# Patient Record
Sex: Male | Born: 1970 | Race: White | Hispanic: No | Marital: Married | Smoking: Never smoker
Health system: Southern US, Community
[De-identification: ages and names within clinical notes are randomized; demographics above are authoritative.]

## PROBLEM LIST (undated history)

## (undated) ENCOUNTER — Emergency Department (HOSPITAL_COMMUNITY): Discharge status: Absent without leave | Payer: PRIVATE HEALTH INSURANCE

## (undated) DIAGNOSIS — K922 Gastrointestinal hemorrhage, unspecified: Secondary | ICD-10-CM

## (undated) DIAGNOSIS — G459 Transient cerebral ischemic attack, unspecified: Secondary | ICD-10-CM

## (undated) DIAGNOSIS — G43909 Migraine, unspecified, not intractable, without status migrainosus: Secondary | ICD-10-CM

## (undated) DIAGNOSIS — R339 Retention of urine, unspecified: Secondary | ICD-10-CM

## (undated) DIAGNOSIS — Z789 Other specified health status: Secondary | ICD-10-CM

## (undated) DIAGNOSIS — K219 Gastro-esophageal reflux disease without esophagitis: Secondary | ICD-10-CM

## (undated) HISTORY — DX: Transient cerebral ischemic attack, unspecified: G45.9

## (undated) HISTORY — PX: REMOVAL OF GASTROINTESTINAL STOMATIC  TUMOR OF STOMACH: SHX6339

## (undated) HISTORY — DX: Migraine, unspecified, not intractable, without status migrainosus: G43.909

## (undated) HISTORY — PX: HERNIA REPAIR: SHX51

## (undated) HISTORY — PX: OTHER SURGICAL HISTORY: SHX169

## (undated) HISTORY — DX: Gastro-esophageal reflux disease without esophagitis: K21.9

## (undated) HISTORY — PX: CHOLECYSTECTOMY: SHX55

## (undated) HISTORY — DX: Gastrointestinal hemorrhage, unspecified: K92.2

---

## 2006-03-13 ENCOUNTER — Emergency Department (HOSPITAL_COMMUNITY): Admission: EM | Admit: 2006-03-13 | Discharge: 2006-03-13 | Payer: Self-pay | Admitting: Family Medicine

## 2006-04-05 HISTORY — PX: NISSEN FUNDOPLICATION: SHX2091

## 2006-08-11 ENCOUNTER — Ambulatory Visit: Payer: Self-pay | Admitting: Gastroenterology

## 2006-08-17 ENCOUNTER — Ambulatory Visit: Payer: Self-pay | Admitting: Gastroenterology

## 2006-09-05 ENCOUNTER — Ambulatory Visit: Payer: Self-pay | Admitting: Gastroenterology

## 2006-10-10 ENCOUNTER — Ambulatory Visit (HOSPITAL_COMMUNITY): Admission: RE | Admit: 2006-10-10 | Discharge: 2006-10-10 | Payer: Self-pay | Admitting: Gastroenterology

## 2006-10-10 ENCOUNTER — Encounter: Payer: Self-pay | Admitting: Gastroenterology

## 2006-10-28 ENCOUNTER — Ambulatory Visit: Payer: Self-pay | Admitting: Gastroenterology

## 2006-12-20 ENCOUNTER — Encounter: Admission: RE | Admit: 2006-12-20 | Discharge: 2006-12-20 | Payer: Self-pay | Admitting: Surgery

## 2007-01-13 ENCOUNTER — Inpatient Hospital Stay (HOSPITAL_COMMUNITY): Admission: RE | Admit: 2007-01-13 | Discharge: 2007-01-14 | Payer: Self-pay | Admitting: Surgery

## 2007-03-23 ENCOUNTER — Ambulatory Visit: Payer: Self-pay | Admitting: Gastroenterology

## 2007-07-26 DIAGNOSIS — K449 Diaphragmatic hernia without obstruction or gangrene: Secondary | ICD-10-CM | POA: Insufficient documentation

## 2007-07-26 DIAGNOSIS — J329 Chronic sinusitis, unspecified: Secondary | ICD-10-CM | POA: Insufficient documentation

## 2007-08-02 ENCOUNTER — Emergency Department (HOSPITAL_COMMUNITY): Admission: EM | Admit: 2007-08-02 | Discharge: 2007-08-02 | Payer: Self-pay | Admitting: Emergency Medicine

## 2008-04-05 HISTORY — PX: CHOLECYSTECTOMY: SHX55

## 2009-03-07 ENCOUNTER — Inpatient Hospital Stay (HOSPITAL_COMMUNITY): Admission: AD | Admit: 2009-03-07 | Discharge: 2009-03-08 | Payer: Self-pay | Admitting: Surgery

## 2009-03-08 ENCOUNTER — Encounter (INDEPENDENT_AMBULATORY_CARE_PROVIDER_SITE_OTHER): Payer: Self-pay | Admitting: Surgery

## 2009-09-12 ENCOUNTER — Ambulatory Visit (HOSPITAL_BASED_OUTPATIENT_CLINIC_OR_DEPARTMENT_OTHER): Admission: RE | Admit: 2009-09-12 | Discharge: 2009-09-12 | Payer: Self-pay | Admitting: Urology

## 2010-06-22 LAB — POCT HEMOGLOBIN-HEMACUE: Hemoglobin: 14.9 g/dL (ref 13.0–17.0)

## 2010-07-07 LAB — DIFFERENTIAL
Basophils Absolute: 0 10*3/uL (ref 0.0–0.1)
Basophils Relative: 1 % (ref 0–1)
Lymphocytes Relative: 7 % — ABNORMAL LOW (ref 12–46)
Lymphs Abs: 0.5 10*3/uL — ABNORMAL LOW (ref 0.7–4.0)
Monocytes Absolute: 0.8 10*3/uL (ref 0.1–1.0)
Monocytes Relative: 12 % (ref 3–12)
Neutro Abs: 5.6 10*3/uL (ref 1.7–7.7)
Neutrophils Relative %: 81 % — ABNORMAL HIGH (ref 43–77)

## 2010-07-07 LAB — COMPREHENSIVE METABOLIC PANEL
ALT: 724 U/L — ABNORMAL HIGH (ref 0–53)
BUN: 5 mg/dL — ABNORMAL LOW (ref 6–23)
CO2: 24 mEq/L (ref 19–32)
Calcium: 8.9 mg/dL (ref 8.4–10.5)
GFR calc non Af Amer: >60 mL/min (ref >60)
Total Protein: 6.4 g/dL (ref 6.0–8.3)

## 2010-07-07 LAB — AMYLASE: Amylase: 1292 U/L — ABNORMAL HIGH (ref 0–105)

## 2010-07-07 LAB — CBC
MCV: 92 fL (ref 78.0–100.0)
Platelets: 224 10*3/uL (ref 150–400)
RBC: 4.3 MIL/uL (ref 4.22–5.81)
WBC: 6.9 10*3/uL (ref 4.0–10.5)

## 2010-08-18 NOTE — Letter (Signed)
October 28, 2006    Thornton Park. Daphine Deutscher, MD  [address], [address]  [place], [place] [address]   RE:  Jared Henry, Jared Henry  MRN:  [id number]  /  DOB:  06/06/1970   Dear Susy Frizzle:   I would appreciate if you could see my patient, Jared Henry, for  consideration of laparoscopic fundoplication for his continued acid  reflux symptoms and inability to tolerate PPI therapy because of rather  severe allergic reactions.  Enclosed is my note from Aug 11, 2006, and  also a copy of his 24-hour pH probe, and manometry reports.  His  manometry was fairly normal except for borderline incompetent lower  esophageal sphincter.  Somewhat surprising, his 24-hour pH probe study  really showed no significant acid reflux and he was not on therapy at  that time.  He relates that during that time he had a good day.   Jared Henry certainly has rather classic acid reflux symptoms and his  endoscopic exam confirmed a 4-to-5-cm hiatal hernia without erosive  esophagitis.  He had some dysphagia at the time of his procedure and I  did an empiric dilation.  He no longer complains of swallowing  difficulties.  I am convinced that this patient has acid reflux and at  his young age he would probably benefit from  surgical therapy.  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  He is otherwise healthy and on no medications.   Thank you for your time and help in this matter.    Sincerely,      Vania Rea. Jarold Motto, MD, Caleen Essex, FAGA  Electronically Signed    DRP/MedQ  DD: 10/28/2006  DT: 10/28/2006  Job #: [id number]

## 2010-08-18 NOTE — Assessment & Plan Note (Signed)
[facility]                         GASTROENTEROLOGY OFFICE NOTE   NAME:Jared Henry, Jared Henry                           MRN:          [id number]  DATE:10/10/2006                            DOB:          1970/05/09    TWENTY-FOUR HOUR PH PROBE TEST AND ESOPHAGEAL MANOMETRY.   Jared Henry is a 40 year old white male who has had refractory reflux  symptoms with burning substernal chest pain.  He had an allergic  reaction to PPI therapy and is currently on Zantac 300 mg twice a day  but continues with reflux symptoms.  He had endoscopy performed on Aug 17, 2006, which did show a hiatal hernia and no definite stricture of  his esophagus.  He was dilated, however, empirically.   ESOPHAGEAL MANOMETRY REPORT #1:  1. Upper esophageal sphincter - There is normal coordination between      pharyngeal contraction and cricopharyngeal relaxation.  2. Lower esophageal sphincter - Borderline normal pressure here      measuring 15 mmHg with normal relaxation.  3. Motility pattern - There are normally propagated peristaltic waves      throughout the length of the esophagus to wet and dry swallows.      Amplitude of duration throughout the esophagus 65 mmHg with 87 mm      average in the distal esophagus.  All waves appear peristaltic.   ASSESSMENT:  This is essentially a normal esophageal manometry except  for borderline lower esophageal sphincter pressure of 15 mmHg.   TWENTY FOUR HOUR PH PROBE STUDY:  A dual pH probe is inserted on October 10, 2006.  This study is entirely normal with a DeMeester score of 1.1,  normal being less than 22.  There is no significant acid reflux  proximally or distally in the supine or upright position.   ASSESSMENT:  These two studies do show evidence of borderline lower  esophageal sphincter incompetency.  There is certainly no significant  acid reflux on this study and it is unclear to me as to whether or not  he was on his H2 blocker meds  before this study, but we had asked him to  be off of them at least 3 days before his procedure.  If he was off of  his meds and still had such good acid control I am not sure that  fundoplication surgery would be indicated.  However, clinically, the  patient continues to be symptomatic and cannot take PPI therapy.  I will  ask Dr. Wenda Henry to see him, in any case, and review this data and  give [place] his opinion.     Vania Rea. Jarold Motto, MD, Caleen Essex, FAGA  Electronically Signed    DRP/MedQ  DD: 10/20/2006  DT: 10/20/2006  Job #: [id number]   cc:   Thornton Park Daphine Deutscher, MD

## 2010-08-18 NOTE — Assessment & Plan Note (Signed)
[facility]                         GASTROENTEROLOGY OFFICE NOTE   NAME:Roesler, MARJORIE LUSSIER                         MRN:          [id number]  DATE:09/05/2006                            DOB:          1970/09/08    Everet returns for followup after his endoscopy on Aug 17, 2006 with  esophageal dilatation. He cannot tolerate PPIs because of allergic  urticaria. He was initially on Nexium but was switched to AcipHex, he  continued to have problems. He currently is taking Zantac 300 mg twice a  day with mild improvement but continues his acid reflux symptoms,  coughing, and some mild dysphagia.   I have gone ahead and set Wright up for a 24-hour pH probe test and  esophageal manometry and will probably refer him to Dr. Wenda Low at  [facility] for consideration of fundoplication since he  cannot tolerate PPI therapy. I have asked him to hold his H2 blocker  therapy at least 3 days before his procedures. He does have some  Carafate to use on a p.r.n. basis in the interim.     Vania Rea. Jarold Motto, MD, Caleen Essex, FAGA  Electronically Signed    DRP/MedQ  DD: 09/05/2006  DT: 09/05/2006  Job #: [id number]   cc:   Alfredia Client, MD  Thornton Park. Daphine Deutscher, MD

## 2010-08-18 NOTE — Assessment & Plan Note (Signed)
[facility]                         GASTROENTEROLOGY OFFICE NOTE   NAME:Pore, SAATHVIK EVERY                         MRN:          [id number]  DATE:08/11/2006                            DOB:          04/06/1970    Mr. 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 Morrie Sheldon for evaluation of  acid reflux symptoms and dysphagia.   Mr. Morrie Sheldon has been in excellent health all of his life until a couple of  months ago when he developed rather typical burning sternal chest pain  radiating to his shoulders and neck, both during the daytime and  nighttime hours.  He has been on antacids and recently proton pump  inhibitors, but continues to have symptomatology and occasional solid  food dysphagia in his midsubsternal area.  He has not been on  antibiotics, but does complain of hoarseness and a dry cough with some  asthmatic-type symptoms.  He has not had barium studies or endoscopic  exams.  He denies frequent antibiotics or steroid use.  He also  describes occasional odynophagia.  He is currently taking Nexium 40 mg  twice a day an hour before meals, and has had some improvement.  Lab  data per Dr. Morrie Sheldon showed a normal CBC and chest x-ray.   He follows a regular diet and denies any specific food intolerances.  He  denies melena or hematochezia, but has noted some dark stools with Pepto-  Bismol use.  Because of these above complaints, he has lost  approximately 10 pounds of weight over the last 2 months.   PAST MEDICAL HISTORY:  Otherwise entirely negative, except for some  allergic sinusitis problems.   FAMILY HISTORY:  Noncontributory.  His mother does have a hiatal  hernia.   SOCIAL HISTORY:  The patient is married and lives with his wife and  children.  He has a Naval architect.  He does not smoke or use  ethanol.   MEDICATIONS:  1. Nexium 40 mg twice a day.  2. P.r.n. albuterol inhalers.   He denies drug allergies.   REVIEW OF SYSTEMS:  Positive for shortness of breath with exertion, some  wheezing, insomnia, periodic episodes of tachycardia, and some  nonspecific pruritus.  He denies other specific cardiopulmonary  complaints, neurological, or psychiatric difficulties.  He has no  symptomatology consistent with collagen vascular disease.  He  specifically denies Raynaud's phenomenon.   PHYSICAL EXAMINATION:  GENERAL:  A healthy-appearing white male in no  distress, appearing his stated age.  VITAL SIGNS:  He is 5 feet 9 inches tall, and weighs 189 pounds.  Blood  pressure was 124/92, and pulse was 64 and regular.  HEENT:  Examination of the oropharynx was unremarkable.  NECK:  I could not appreciate thyromegaly or lymphadenopathy.  CHEST:  Clear, without wheezes or rhonchi.  HEART:  He was in a regular rhythm, without murmurs, gallops, or rubs.  ABDOMEN:  Could not appreciate hepatosplenomegaly, abdominal masses, or  tenderness.  Bowel sounds were normal.  EXTREMITIES:  Unremarkable.  MENTAL STATUS:  Clear.  RECTAL:  Deferred.   ASSESSMENT:  Mr. Hostetler has rather marked acid reflux, with  extraesophageal manifestations of GERD.  I am concerned about his  dysphagia and odynophagia, and I feel that endoscopy is indicated to  exclude ulcerative esophagitis or other causes of esophagitis such as  fungal or viral infections.  I can see no lesions on his oropharyngeal  exam today.  He is otherwise healthy and has no chronic medical history.  A patient this young with severe acid reflux usually prompts  consideration of fundoplication surgery early in the course of the  disease.   RECOMMENDATIONS:  1. Continue Nexium twice a day 30 minutes before meals.  2. Carafate suspension 5 cc after meals and at bedtime.  3. Endoscopy with probable dilatation and biopsies as soon as      possible.  4. If fundoplication is indicated, we will need to do preoperative      manometry  exam.  5. Patient education movie on acid reflux shown to the patient and his      wife.     Vania Rea. Jarold Motto, MD, Caleen Essex, FAGA  Electronically Signed    DRP/MedQ  DD: 08/11/2006  DT: 08/11/2006  Job #: [id number]   cc:   Alfredia Client, MD

## 2010-08-18 NOTE — Op Note (Signed)
NAME:  Jared Henry, Jared Henry NO.:  [id number]   MEDICAL RECORD NO.:  [id number]          PATIENT TYPE:  OIB   LOCATION:  1537                         FACILITY:  [facility]   PHYSICIAN:  Thornton Park. 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   PROCEDURE:  Laparoscopic Nissen fundoplication over a #56 lighted bougie  dilator closing the hiatus with two pledgeted sutures of Surgidac with a  three suture wrap on the distal esophagus placing clips on the upper two  knots on the wrap.   SURGEON:  Thornton Park. Daphine Deutscher, MD   ASSISTANT:  Adolph Pollack, M.D.   ANESTHESIA:  General endotracheal.   DESCRIPTION OF PROCEDURE:  The patient was taken to room 1, given  general anesthesia.  The abdomen was clipped, prepped with Techni-Care  and draped sterilely.  Access the abdomen was gained using OptiVu  technique using the [employer] device through the left upper  quadrant without difficulty.  Pneumoperitoneum was established and the  scope was introduced.  Standard trocar placements were used and the iron  man was used retract the liver.   Harmonic scalpel was used to take down the gastrohepatic ligament  exposing the right crus, carried anteriorly across to the left side.  I  then went down onto the fundus and started taking down short gastrics.  I did get into some bleeding along one of the short gastrics that  required multiple clip applications before we really regained control.  This stopped this bleeding along with the harmonic scalpel.  The short  gastrics were taken down all the way, taking the stomach fundus off  attachments to the spleen where they were somewhat intimately  attached  at the diaphragm.  This was freed up and this revealed a small hiatal  hernia which was taken down from around the left crus.  At that point, I  had the really dissected both right and left crus and could see beneath  the left side as well.   I went around on the right side again and dissected this area using  Kitner dissectors and put a Penrose drain around the distal end of it.   I then placed two pledgeted sutures posteriorly to close the diaphragm  hiatus.  A #56 dilator was passed by Dr. Carney Living.  The wrap was made  with continuous shoe shine maneuver and invaginated distal esophagus  using three sutures using the Endo stitch.  Clips were applied on the  upper two knots.  These were all tied intracorporeally.  I then  irrigated and surveyed the abdomen.  Some old clot was removed. Surveyed  and no bleeding was noted.  Everything else looked to be in order.  The liver retractor was removed  and the abdomen was deflated.  The port sites were closed with 4-0  Vicryl, Benzoin and Steri-Strips.  The patient tolerated procedure well  and was taken to recovery room in satisfactory condition.      Thornton Park Daphine Deutscher, MD  Electronically Signed     MBM/MEDQ  D:  01/12/2007  T:  01/13/2007  Job:  [id number]   cc:   Alfredia Client, MD  Fax: [phone]   Vania Rea. Jarold Motto, MD, FACG, FACP, FAGA  [address]  [place]  [place] [address]

## 2011-01-14 LAB — CBC
HCT: 36.6 — ABNORMAL LOW
HCT: 36.6 — ABNORMAL LOW
MCHC: 35.3
MCV: 88
RBC: 4.18 — ABNORMAL LOW
RDW: 12.7
RDW: 12.8
RDW: 12.9
WBC: 12.5 — ABNORMAL HIGH
WBC: 7.8

## 2012-01-31 ENCOUNTER — Other Ambulatory Visit (HOSPITAL_COMMUNITY): Payer: Self-pay | Admitting: Family Medicine

## 2012-01-31 ENCOUNTER — Ambulatory Visit (HOSPITAL_COMMUNITY): Payer: PRIVATE HEALTH INSURANCE | Attending: Cardiology | Admitting: Radiology

## 2012-01-31 DIAGNOSIS — R0989 Other specified symptoms and signs involving the circulatory and respiratory systems: Secondary | ICD-10-CM

## 2012-01-31 DIAGNOSIS — H539 Unspecified visual disturbance: Secondary | ICD-10-CM

## 2012-01-31 NOTE — Progress Notes (Signed)
Echocardiogram not performed. Transesopageal echo was ordered by physician but transthoracic was scheduled. Patient and PCP was notified.

## 2012-04-05 DIAGNOSIS — K922 Gastrointestinal hemorrhage, unspecified: Secondary | ICD-10-CM

## 2012-04-05 HISTORY — DX: Gastrointestinal hemorrhage, unspecified: K92.2

## 2012-04-05 HISTORY — PX: REMOVAL OF GASTROINTESTINAL STOMATIC  TUMOR OF STOMACH: SHX6339

## 2013-01-02 ENCOUNTER — Ambulatory Visit (INDEPENDENT_AMBULATORY_CARE_PROVIDER_SITE_OTHER): Payer: PRIVATE HEALTH INSURANCE

## 2013-01-02 ENCOUNTER — Encounter: Payer: Self-pay | Admitting: Family Medicine

## 2013-01-02 ENCOUNTER — Ambulatory Visit (INDEPENDENT_AMBULATORY_CARE_PROVIDER_SITE_OTHER): Payer: PRIVATE HEALTH INSURANCE | Admitting: Family Medicine

## 2013-01-02 VITALS — BP 106/69 | HR 109 | Temp 98.7°F | Ht 68.0 in | Wt 175.4 lb

## 2013-01-02 DIAGNOSIS — M25569 Pain in unspecified knee: Secondary | ICD-10-CM

## 2013-01-02 DIAGNOSIS — M25561 Pain in right knee: Secondary | ICD-10-CM

## 2013-01-02 MED ORDER — METHYLPREDNISOLONE ACETATE 80 MG/ML IJ SUSP: 80.0000 mg | Freq: Once | INTRAMUSCULAR | Status: DC

## 2013-01-02 NOTE — Patient Instructions (Signed)
Knee Pain  The knee is the complex joint between your thigh and your lower leg. It is made up of bones, tendons, ligaments, and cartilage. The bones that make up the knee are:   The femur in the thigh.   The tibia and fibula in the lower leg.   The patella or kneecap riding in the groove on the lower femur.  CAUSES   Knee pain is a common complaint with many causes. A few of these causes are:   Injury, such as:   A ruptured ligament or tendon injury.   Torn cartilage.   Medical conditions, such as:   Gout   Arthritis   Infections   Overuse, over training or overdoing a physical activity.  Knee pain can be minor or severe. Knee pain can accompany debilitating injury. Minor knee problems often respond well to self-care measures or get well on their own. More serious injuries may need medical intervention or even surgery.  SYMPTOMS  The knee is complex. Symptoms of knee problems can vary widely. Some of the problems are:   Pain with movement and weight bearing.   Swelling and tenderness.   Buckling of the knee.   Inability to straighten or extend your knee.   Your knee locks and you cannot straighten it.   Warmth and redness with pain and fever.   Deformity or dislocation of the kneecap.  DIAGNOSIS   Determining what is wrong may be very straight forward such as when there is an injury. It can also be challenging because of the complexity of the knee. Tests to make a diagnosis may include:   Your caregiver taking a history and doing a physical exam.   Routine X-rays can be used to rule out other problems. X-rays will not reveal a cartilage tear. Some injuries of the knee can be diagnosed by:   Arthroscopy a surgical technique by which a small video camera is inserted through tiny incisions on the sides of the knee. This procedure is used to examine and repair internal knee joint problems. Tiny instruments can be used during arthroscopy to repair the torn knee cartilage (meniscus).   Arthrography  is a radiology technique. A contrast liquid is directly injected into the knee joint. Internal structures of the knee joint then become visible on X-ray film.   An MRI scan is a non x-ray radiology procedure in which magnetic fields and a computer produce two- or three-dimensional images of the inside of the knee. Cartilage tears are often visible using an MRI scanner. MRI scans have largely replaced arthrography in diagnosing cartilage tears of the knee.   Blood work.   Examination of the fluid that helps to lubricate the knee joint (synovial fluid). This is done by taking a sample out using a needle and a syringe.  TREATMENT  The treatment of knee problems depends on the cause. Some of these treatments are:   Depending on the injury, proper casting, splinting, surgery or physical therapy care will be needed.   Give yourself adequate recovery time. Do not overuse your joints. If you begin to get sore during workout routines, back off. Slow down or do fewer repetitions.   For repetitive activities such as cycling or running, maintain your strength and nutrition.   Alternate muscle groups. For example if you are a weight lifter, work the upper body on one day and the lower body the next.   Either tight or weak muscles do not give the proper support for your   knee. Tight or weak muscles do not absorb the stress placed on the knee joint. Keep the muscles surrounding the knee strong.   Take care of mechanical problems.   If you have flat feet, orthotics or special shoes may help. See your caregiver if you need help.   Arch supports, sometimes with wedges on the inner or outer aspect of the heel, can help. These can shift pressure away from the side of the knee most bothered by osteoarthritis.   A brace called an "unloader" brace also may be used to help ease the pressure on the most arthritic side of the knee.   If your caregiver has prescribed crutches, braces, wraps or ice, use as directed. The acronym for  this is Schwenn. This means protection, rest, ice, compression and elevation.   Nonsteroidal anti-inflammatory drugs (NSAID's), can help relieve pain. But if taken immediately after an injury, they may actually increase swelling. Take NSAID's with food in your stomach. Stop them if you develop stomach problems. Do not take these if you have a history of ulcers, stomach pain or bleeding from the bowel. Do not take without your caregiver's approval if you have problems with fluid retention, heart failure, or kidney problems.   For ongoing knee problems, physical therapy may be helpful.   Glucosamine and chondroitin are over-the-counter dietary supplements. Both may help relieve the pain of osteoarthritis in the knee. These medicines are different from the usual anti-inflammatory drugs. Glucosamine may decrease the rate of cartilage destruction.   Injections of a corticosteroid drug into your knee joint may help reduce the symptoms of an arthritis flare-up. They may provide pain relief that lasts a few months. You may have to wait a few months between injections. The injections do have a small increased risk of infection, water retention and elevated blood sugar levels.   Hyaluronic acid injected into damaged joints may ease pain and provide lubrication. These injections may work by reducing inflammation. A series of shots may give relief for as long as 6 months.   Topical painkillers. Applying certain ointments to your skin may help relieve the pain and stiffness of osteoarthritis. Ask your pharmacist for suggestions. Many over the-counter products are approved for temporary relief of arthritis pain.   In some countries, doctors often prescribe topical NSAID's for relief of chronic conditions such as arthritis and tendinitis. A review of treatment with NSAID creams found that they worked as well as oral medications but without the serious side effects.  PREVENTION   Maintain a healthy weight. Extra pounds put  more strain on your joints.   Get strong, stay limber. Weak muscles are a common cause of knee injuries. Stretching is important. Include flexibility exercises in your workouts.   Be smart about exercise. If you have osteoarthritis, chronic knee pain or recurring injuries, you may need to change the way you exercise. This does not mean you have to stop being active. If your knees ache after jogging or playing basketball, consider switching to swimming, water aerobics or other low-impact activities, at least for a few days a week. Sometimes limiting high-impact activities will provide relief.   Make sure your shoes fit well. Choose footwear that is right for your sport.   Protect your knees. Use the proper gear for knee-sensitive activities. Use kneepads when playing volleyball or laying carpet. Buckle your seat belt every time you drive. Most shattered kneecaps occur in car accidents.   Rest when you are tired.  SEEK MEDICAL CARE IF:     You have knee pain that is continual and does not seem to be getting better.   SEEK IMMEDIATE MEDICAL CARE IF:   Your knee joint feels hot to the touch and you have a high fever.  MAKE SURE YOU:    Understand these instructions.   Will watch your condition.   Will get help right away if you are not doing well or get worse.  Document Released: 01/17/2007 Document Revised: 06/14/2011 Document Reviewed: 01/17/2007  [employer] Patient Information 2014 [employer].

## 2013-01-02 NOTE — Progress Notes (Signed)
  Subjective:    Patient ID: Jared Henry, male    DOB: 1970/12/03, 42 y.o.   MRN: [id number]  HPI This 42 y.o. male presents for evaluation of right knee pain.  He has been running and Noticed a few days ago that he has right knee dicomfort that is increased when he Flexes his knee in.  He has hx of intolerance to NSAIDS and he has had ulcers and  GI bleeding.   Review of Systems C/o knee pain. No chest pain, SOB, HA, dizziness, vision change, N/V, diarrhea, constipation, dysuria, urinary urgency or frequency or rash.     Objective:   Physical Exam  Vital signs noted  Well developed well nourished male.  HEENT - Head atraumatic Normocephalic Respiratory - Lungs CTA bilateral Cardiac - RRR S1 and S2 without murmur MS - TTP right lateral knee and some mild crepitus. Procedure - Right Lateral knee prepped with ETOH pad and then anesthetized adequately with Ethyl Chloride and then using 11/2 inch 25 gauge needle 2 cc's of 2% lidocaine w/o epi and one cc of 80mg  solumedrol injected under the lateral patella and patient tolerates it well.  He describes immediate relief of his discomfort.  Right knee xray - No acute fracture seen. Prelimnary reading by Angeline Slim    Assessment & Plan:  Right knee pain - Plan: DG Knee 1-2 Views Right, methylPREDNISolone acetate (DEPO-MEDROL) injection 80 mg Right Knee injection and follow up if discomfort continues.

## 2013-01-03 ENCOUNTER — Ambulatory Visit: Payer: Self-pay

## 2013-05-12 ENCOUNTER — Ambulatory Visit (INDEPENDENT_AMBULATORY_CARE_PROVIDER_SITE_OTHER): Payer: PRIVATE HEALTH INSURANCE | Admitting: Family Medicine

## 2013-05-12 ENCOUNTER — Encounter: Payer: Self-pay | Admitting: Family Medicine

## 2013-05-12 VITALS — BP 105/74 | HR 97 | Temp 97.3°F | Ht 68.0 in | Wt 176.0 lb

## 2013-05-12 DIAGNOSIS — J329 Chronic sinusitis, unspecified: Secondary | ICD-10-CM

## 2013-05-12 DIAGNOSIS — J029 Acute pharyngitis, unspecified: Secondary | ICD-10-CM

## 2013-05-12 LAB — POCT RAPID STREP A (OFFICE): RAPID STREP A SCREEN: NEGATIVE

## 2013-05-12 MED ORDER — AMOXICILLIN 500 MG PO CAPS: 500.0000 mg | ORAL_CAPSULE | Freq: Three times a day (TID) | ORAL | Status: DC

## 2013-05-12 NOTE — Patient Instructions (Signed)
Drink plenty of fluids Take Tylenol or Advil as needed for aches pains and fever Use saline nose spray through the day and or use a Nettie pot 3 or 4 times daily for several days Take medication as directed

## 2013-05-12 NOTE — Progress Notes (Signed)
Subjective:    Patient ID: Jared Henry, male    DOB: October 28, 1970, 43 y.o.   MRN: [id number]  HPI Patient here today for cough, sinus, sore throat x 2 weeks .      Patient Active Problem List   Diagnosis Date Noted  . SINUSITIS 07/26/2007  . HIATAL HERNIA 07/26/2007   Outpatient Encounter Prescriptions as of 05/12/2013  Medication Sig  . amitriptyline (ELAVIL) 25 MG tablet Take 75 mg by mouth at bedtime.  Marland Kitchen. zonisamide (ZONEGRAN) 50 MG capsule Take 150 mg by mouth at bedtime.  . [DISCONTINUED] methylPREDNISolone acetate (DEPO-MEDROL) injection 80 mg     Review of Systems  Constitutional: Negative.  Negative for fever.  HENT: Positive for congestion, sinus pressure and sore throat.   Eyes: Negative.   Respiratory: Positive for cough.   Cardiovascular: Negative.   Gastrointestinal: Negative.   Endocrine: Negative.   Genitourinary: Negative.   Musculoskeletal: Negative.   Skin: Negative.   Allergic/Immunologic: Negative.   Neurological: Positive for headaches.  Hematological: Negative.   Psychiatric/Behavioral: Negative.        Objective:   Physical Exam  Nursing note and vitals reviewed. Constitutional: He is oriented to person, place, and time. He appears well-developed and well-nourished. No distress.  HENT:  Head: Normocephalic and atraumatic.  Right Ear: External ear normal.  Left Ear: External ear normal.  Mouth/Throat: Oropharynx is clear and moist. No oropharyngeal exudate.  Nasal congestion bilaterally and bilateral maxillary sinus tenderness.  Eyes: Conjunctivae and EOM are normal. Pupils are equal, round, and reactive to light. Right eye exhibits no discharge. Left eye exhibits no discharge. No scleral icterus.  Neck: Normal range of motion. Neck supple. No tracheal deviation present. No thyromegaly present.  Cardiovascular: Normal rate, regular rhythm and normal heart sounds.  Exam reveals no gallop and no friction rub.   No murmur heard.  At 96 per  minute  Pulmonary/Chest: Effort normal and breath sounds normal. No respiratory distress. He has no wheezes. He has no rales. He exhibits no tenderness.  No axillary nodes, dry cough  Abdominal: Soft. Bowel sounds are normal. He exhibits no mass. There is no tenderness. There is no rebound and no guarding.  Musculoskeletal: Normal range of motion. He exhibits no edema.  Lymphadenopathy:    He has cervical adenopathy (anterior cervical adenopathy on the right).  Neurological: He is alert and oriented to person, place, and time.  Skin: Skin is warm and dry. No rash noted.  Psychiatric: He has a normal mood and affect. His behavior is normal. Judgment and thought content normal.   BP 105/74  Pulse 97  Temp(Src) 97.3 F (36.3 C) (Oral)  Ht 5\' 8"  (1.727 m)  Wt 176 lb (79.833 kg)  BMI 26.77 kg/m2  The rapid strep was negative.     Assessment & Plan:   1. Sore throat - POCT rapid strep A - Strep A culture, throat - amoxicillin (AMOXIL) 500 MG capsule; Take 1 capsule (500 mg total) by mouth 3 (three) times daily.  Dispense: 30 capsule; Refill: 0  2. Rhinosinusitis - amoxicillin (AMOXIL) 500 MG capsule; Take 1 capsule (500 mg total) by mouth 3 (three) times daily.  Dispense: 30 capsule; Refill: 0 Patient Instructions  Drink plenty of fluids Take Tylenol or Advil as needed for aches pains and fever Use saline nose spray through the day and or use a Nettie pot 3 or 4 times daily for several days Take medication as directed   Jared Henry W. Rilley Stash  MD

## 2013-05-16 LAB — STREP A CULTURE, THROAT: STREP A CULTURE: NEGATIVE

## 2013-06-29 ENCOUNTER — Encounter: Payer: Self-pay | Admitting: Family Medicine

## 2013-06-29 ENCOUNTER — Ambulatory Visit (INDEPENDENT_AMBULATORY_CARE_PROVIDER_SITE_OTHER): Payer: PRIVATE HEALTH INSURANCE | Admitting: Family Medicine

## 2013-06-29 VITALS — BP 105/72 | HR 98 | Temp 97.6°F | Ht 68.5 in | Wt 183.8 lb

## 2013-06-29 DIAGNOSIS — M25561 Pain in right knee: Secondary | ICD-10-CM

## 2013-06-29 DIAGNOSIS — M25569 Pain in unspecified knee: Secondary | ICD-10-CM

## 2013-06-29 NOTE — Progress Notes (Signed)
   Subjective:    Patient ID: Jared HasteJohn W Henry, male    DOB: [birth year], 43 y.o.   MRN: [id number]  HPI This 43 y.o. male presents for evaluation of right knee pain and discomfort.  He is allergic to NSAIDs and he wants knee injection.   Review of Systems C/o right knee pain No chest pain, SOB, HA, dizziness, vision change, N/V, diarrhea, constipation, dysuria, urinary urgency or frequency or rash.     Objective:   Physical Exam  Right knee - TTP medial aspect of right knee. No swelling or crepitus.  Procedure - Medial right knee prepped with ETOH pad and then one cc of 1% lido with 1 cc marcaine And one  Cc of kenalog injected into right knee space and patient expresses relief of discomfort and pain.  Bandaid  Applied.      Assessment & Plan:

## 2013-08-21 ENCOUNTER — Ambulatory Visit (INDEPENDENT_AMBULATORY_CARE_PROVIDER_SITE_OTHER): Payer: PRIVATE HEALTH INSURANCE | Admitting: Family Medicine

## 2013-08-21 VITALS — BP 105/78 | HR 85 | Temp 98.7°F | Ht 68.5 in | Wt 185.2 lb

## 2013-08-21 DIAGNOSIS — M25561 Pain in right knee: Secondary | ICD-10-CM

## 2013-08-21 DIAGNOSIS — M25569 Pain in unspecified knee: Secondary | ICD-10-CM

## 2013-08-21 MED ORDER — METHYLPREDNISOLONE (PAK) 4 MG PO TABS: ORAL_TABLET | ORAL | Status: DC

## 2013-08-21 NOTE — Progress Notes (Signed)
   Subjective:    Patient ID: Jared HasteJohn W Henry, male    DOB: 12-19-1970, 43 y.o.   MRN: [id number]  HPI This 43 y.o. male presents for evaluation of right knee discomfort that radiates into his calf region. He notices this when he runs for a half of a mile.  He cannot take NSAIDS due to gastritis when he Takes NSAIDS.   Review of Systems C/o right knee pain No chest pain, SOB, HA, dizziness, vision change, N/V, diarrhea, constipation, dysuria, urinary urgency or frequency, myalgias, arthralgias or rash.     Objective:   Physical Exam Vital signs noted  Well developed well nourished male.  HEENT - Head atraumatic Normocephalic                Eyes - PERRLA, Conjuctiva - clear Sclera- Clear EOMI                Ears - EAC's Wnl TM's Wnl Gross Hearing WNL                Throat - oropharanx wnl Respiratory - Lungs CTA bilateral Cardiac - RRR S1 and S2 without murmur MS - TTP right posterior knee and calf region.       Assessment & Plan:  Knee pain, right - Plan: methylPREDNIsolone (MEDROL DOSPACK) 4 MG tablet Follow up prn  Deatra CanterWilliam J Ethridge Sollenberger FNP

## 2013-09-10 ENCOUNTER — Telehealth: Payer: Self-pay | Admitting: Family Medicine

## 2013-09-11 ENCOUNTER — Encounter: Payer: Self-pay | Admitting: Family Medicine

## 2013-09-11 ENCOUNTER — Ambulatory Visit (INDEPENDENT_AMBULATORY_CARE_PROVIDER_SITE_OTHER): Payer: PRIVATE HEALTH INSURANCE | Admitting: Family Medicine

## 2013-09-11 VITALS — BP 115/84 | HR 87 | Ht 68.0 in | Wt 176.0 lb

## 2013-09-11 DIAGNOSIS — M25561 Pain in right knee: Secondary | ICD-10-CM

## 2013-09-11 DIAGNOSIS — M25569 Pain in unspecified knee: Secondary | ICD-10-CM

## 2013-09-11 DIAGNOSIS — S83241A Other tear of medial meniscus, current injury, right knee, initial encounter: Secondary | ICD-10-CM

## 2013-09-11 DIAGNOSIS — IMO0002 Reserved for concepts with insufficient information to code with codable children: Secondary | ICD-10-CM

## 2013-09-11 NOTE — Patient Instructions (Signed)
I'm concerned you have a medial meniscus tear. Take tylenol 500mg  1-2 tabs three times a day for pain. Can try topical aspercreme, capsaicin, OR flexall up to 4 times a day for pain and inflammation. Glucosamine sulfate 750mg  twice a day is a supplement that may help. Capsaicin topically up to four times a day may also help with pain. Cortisone injections are an option but you've tried two of these already - I don't think a third would be beneficial. It's important that you continue to stay active. Start physical therapy to strengthen muscles around the joint that hurts to take pressure off of the joint itself. Heat or ice 15 minutes at a time 3-4 times a day as needed to help with pain. Follow up with me in 6 weeks. We will consider an MRI if you're still struggling at that visit.

## 2013-09-12 ENCOUNTER — Encounter: Payer: Self-pay | Admitting: Family Medicine

## 2013-09-12 ENCOUNTER — Other Ambulatory Visit: Payer: Self-pay

## 2013-09-12 DIAGNOSIS — M25561 Pain in right knee: Secondary | ICD-10-CM | POA: Insufficient documentation

## 2013-09-12 DIAGNOSIS — M25569 Pain in unspecified knee: Secondary | ICD-10-CM

## 2013-09-12 NOTE — Progress Notes (Signed)
Patient ID: Jared Henry, male   DOB: [birth year], 43 y.o.   MRN: [id number]  PCP: Rudi Heap, MD  Subjective:   HPI: Patient is a 43 y.o. male here as a consult from Nils Pyle FNP for right knee pain.  Patient reports pain started over 9 months ago he started getting right knee pain. No obvious twisting or other injury. Saw PCP for this - tried 2 cortisone injections and medrol dose pack.  First injection provided relief for some months, most recent one only a couple weeks. Radiographs from 10/13 were normal. Has an intolerance to ibuprofen, aleve due to ulcers. No catching, locking, giving out. No prior right knee issues.  Past Medical History  Diagnosis Date  . GERD (gastroesophageal reflux disease)   . Migraines   . TIA (transient ischemic attack)     Current Outpatient Prescriptions on File Prior to Visit  Medication Sig Dispense Refill  . amitriptyline (ELAVIL) 25 MG tablet Take 75 mg by mouth at bedtime.       No current facility-administered medications on file prior to visit.    Past Surgical History  Procedure Laterality Date  . Cholecystectomy    . Hernia repair    . 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 Nexium [Esomeprazole Magnesium] Itching    History   Social History  . Marital Status: Married    Spouse Name: N/A    Number of Children: N/A  . Years of Education: N/A   Occupational History  . Not on file.   Social History Main Topics  . Smoking status: Never Smoker   . Smokeless tobacco: Not on file  . Alcohol Use: No  . Drug Use: No  . Sexual Activity: Not on file   Other Topics Concern  . Not on file   Social History Narrative  . No narrative on file    Family History  Problem Relation Age of Onset  . Cancer Mother   . Hypertension Father     BP 115/84  Pulse 87  Ht 5\' 8"  (1.727 m)  Wt 176 lb (79.833 kg)  BMI 26.77 kg/m2  Review of Systems: See HPI above.     Objective:  Physical Exam:  Gen: NAD  Right knee: No gross deformity, ecchymoses, effusion. TTP medial joint line.  No other tenderness. FROM. Negative ant/post drawers. Negative valgus/varus testing. Negative lachmanns. Positive mcmurrays, apleys, thessalys.  Negative patellar apprehension, clarkes. NV intact distally.    Assessment & Plan:  1. Right knee pain - radiographs negative through they were non weight bearing.  Exam consistent with a medial meniscus tear.  He will start physical therapy with meniscus tear protocol.  Tylenol, glucosamine, topicals discussed.  Will not repeat injection again.  Activities as tolerated.  F/u in 6 weeks.  Consider MRI if not improving as expected.

## 2013-09-12 NOTE — Assessment & Plan Note (Signed)
radiographs negative through they were non weight bearing.  Exam consistent with a medial meniscus tear.  He will start physical therapy with meniscus tear protocol.  Tylenol, glucosamine, topicals discussed.  Will not repeat injection again.  Activities as tolerated.  F/u in 6 weeks.  Consider MRI if not improving as expected.

## 2014-01-26 IMAGING — CR DG KNEE 1-2V*R*
2 series · 2 of 2 positions shown · non-contrast
Comparison: None.

CLINICAL DATA: Right knee pain

EXAM:
RIGHT KNEE - 1-2 VIEW

[view not recorded (1 of 2)]
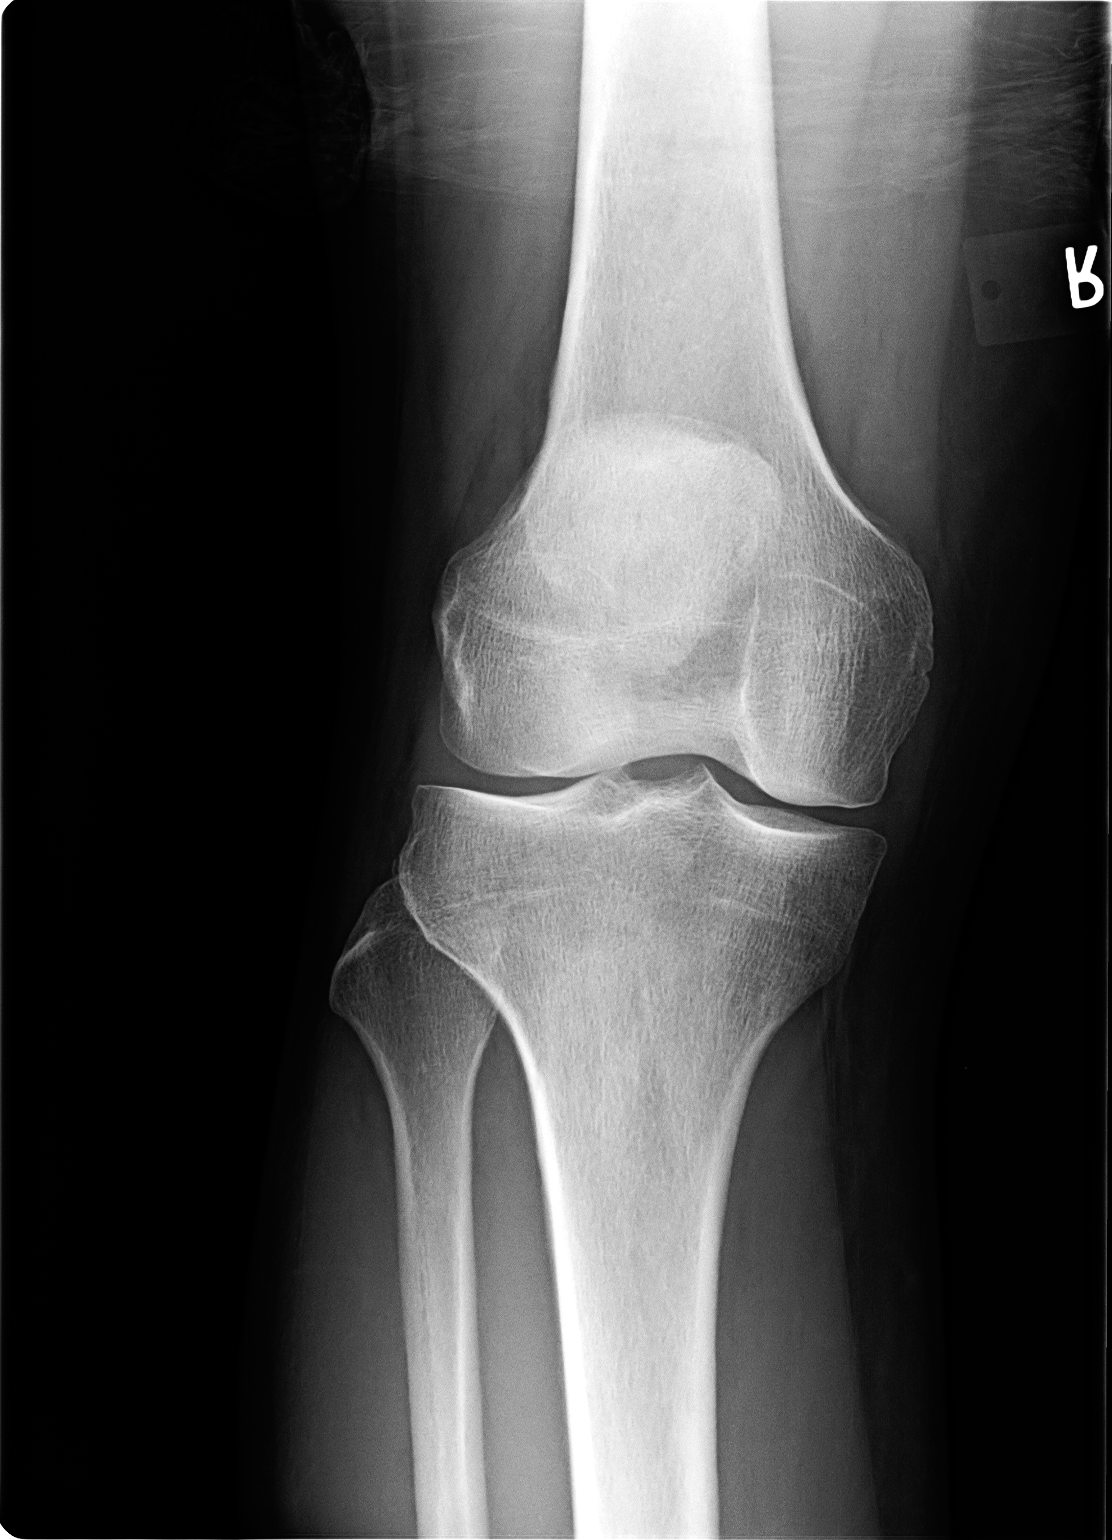

[view not recorded (2 of 2)]
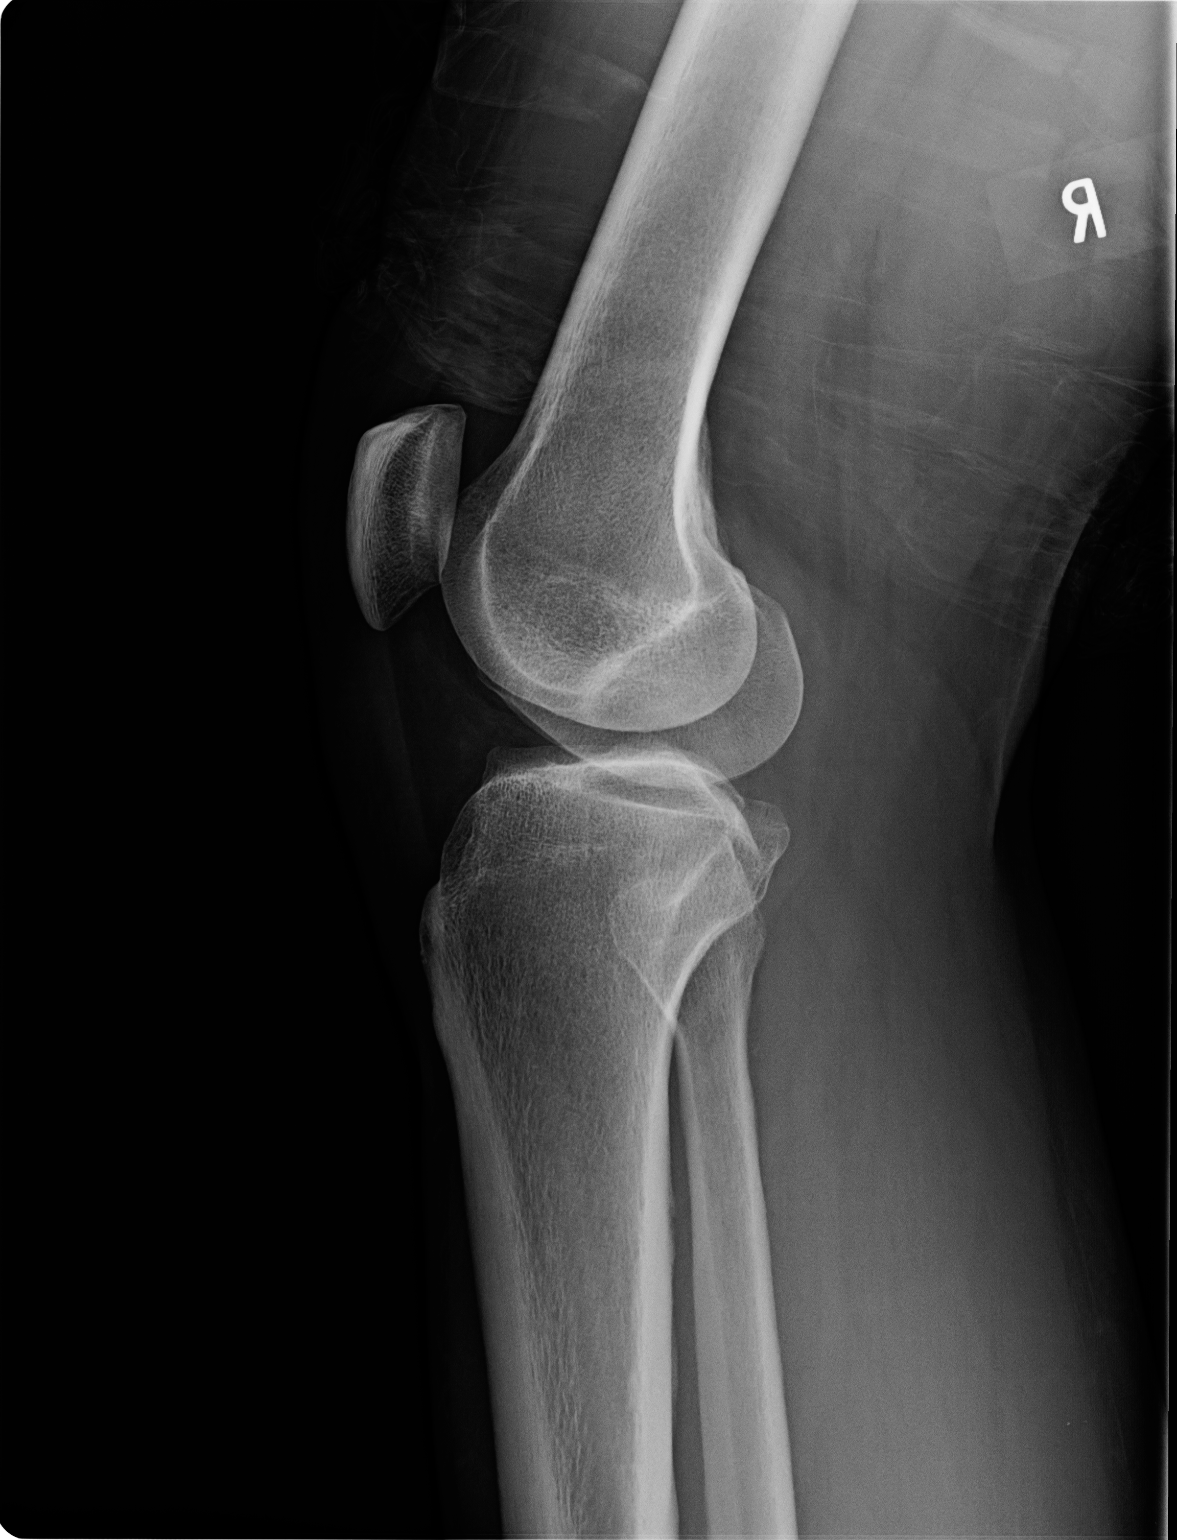

[2 of 2 positions shown; findings below may reference images not displayed]

FINDINGS: There is no evidence of fracture, dislocation, or joint effusion.
There is no evidence of arthropathy or other focal bone abnormality.
Soft tissues are unremarkable.
IMPRESSION: Negative.

## 2014-02-05 ENCOUNTER — Encounter: Payer: Self-pay | Admitting: Nurse Practitioner

## 2014-02-05 ENCOUNTER — Ambulatory Visit (INDEPENDENT_AMBULATORY_CARE_PROVIDER_SITE_OTHER): Payer: PRIVATE HEALTH INSURANCE | Admitting: Nurse Practitioner

## 2014-02-05 VITALS — BP 114/82 | HR 96 | Temp 97.8°F | Ht 69.5 in | Wt 192.4 lb

## 2014-02-05 DIAGNOSIS — Z23 Encounter for immunization: Secondary | ICD-10-CM

## 2014-02-05 DIAGNOSIS — N3010 Interstitial cystitis (chronic) without hematuria: Secondary | ICD-10-CM | POA: Insufficient documentation

## 2014-02-05 DIAGNOSIS — G43511 Persistent migraine aura without cerebral infarction, intractable, with status migrainosus: Secondary | ICD-10-CM

## 2014-02-05 DIAGNOSIS — Z0000 Encounter for general adult medical examination without abnormal findings: Secondary | ICD-10-CM

## 2014-02-05 DIAGNOSIS — G43909 Migraine, unspecified, not intractable, without status migrainosus: Secondary | ICD-10-CM | POA: Insufficient documentation

## 2014-02-05 HISTORY — DX: Interstitial cystitis (chronic) without hematuria: N30.10

## 2014-02-05 LAB — POCT CBC
GRANULOCYTE PERCENT: 67.9 % (ref 37–80)
HCT, POC: 46.4 % (ref 43.5–53.7)
HEMOGLOBIN: 15.9 g/dL (ref 14.1–18.1)
LYMPH, POC: 1.5 (ref 0.6–3.4)
MCH, POC: 30.4 pg (ref 27–31.2)
MCHC: 34.1 g/dL (ref 31.8–35.4)
MCV: 89.1 fL (ref 80–97)
MPV: 7.8 fL (ref 0–99.8)
POC Granulocyte: 3.9 (ref 2–6.9)
POC LYMPH %: 26.6 % (ref 10–50)
Platelet Count, POC: 287 10*3/uL (ref 142–424)
RBC: 5.2 M/uL (ref 4.69–6.13)
RDW, POC: 12.5 %
WBC: 5.8 10*3/uL (ref 4.6–10.2)

## 2014-02-05 NOTE — Patient Instructions (Signed)

## 2014-02-05 NOTE — Progress Notes (Signed)
   Subjective:    Patient ID: Jared Henry, male    DOB: 1970-05-24, 43 y.o.   MRN: [id number]  HPI Patient here today for CPE . He is doing well. Current medical problems include: -migraines- takes zonisamde- only has migraines on are occassions - interstitual cystitis-elavil helps control symptoms.    Review of Systems  Constitutional: Negative.   HENT: Negative.   Respiratory: Negative.   Cardiovascular: Negative.   Genitourinary: Negative.   Neurological: Negative.   Psychiatric/Behavioral: Negative.   All other systems reviewed and are negative.      Objective:   Physical Exam  Constitutional: He is oriented to person, place, and time. He appears well-developed and well-nourished.  HENT:  Head: Normocephalic.  Right Ear: External ear normal.  Left Ear: External ear normal.  Nose: Nose normal.  Mouth/Throat: Oropharynx is clear and moist.  Eyes: EOM are normal. Pupils are equal, round, and reactive to light.  Neck: Normal range of motion. Neck supple. No JVD present. No thyromegaly present.  Cardiovascular: Normal rate, regular rhythm, normal heart sounds and intact distal pulses.  Exam reveals no gallop and no friction rub.   No murmur heard. Pulmonary/Chest: Effort normal and breath sounds normal. No respiratory distress. He has no wheezes. He has no rales. He exhibits no tenderness.  Abdominal: Soft. Bowel sounds are normal. He exhibits no mass. There is no tenderness.  Genitourinary: Prostate normal and penis normal.  deferred penile exam- had vasectomy 4 days ago.  Musculoskeletal: Normal range of motion. He exhibits no edema.  Lymphadenopathy:    He has no cervical adenopathy.  Neurological: He is alert and oriented to person, place, and time. No cranial nerve deficit.  Skin: Skin is warm and dry.  Psychiatric: He has a normal mood and affect. His behavior is normal. Judgment and thought content normal.   BP 114/82 mmHg  Pulse 96  Temp(Src) 97.8 F (36.6 C)  (Oral)  Ht 5' 9.5" (1.765 m)  Wt 192 lb 6.4 oz (87.272 kg)  BMI 28.01 kg/m2        Assessment & Plan:   1. Annual physical exam   2. Cystitis, interstitial   3. Intractable persistent migraine aura without cerebral infarction and with status migrainosus    Orders Placed This Encounter  Procedures  . CMP14+EGFR  . NMR, lipoprofile  . PSA, total and free  . POCT CBC   Health maintenance reviewed Labs pending Follow up in 1 year  [place], FNP

## 2014-02-06 LAB — CMP14+EGFR
ALBUMIN: 4.6 g/dL (ref 3.5–5.5)
ALT: 16 IU/L (ref 0–44)
AST: 19 IU/L (ref 0–40)
Albumin/Globulin Ratio: 1.8 (ref 1.1–2.5)
Alkaline Phosphatase: 85 IU/L (ref 39–117)
BILIRUBIN TOTAL: 0.5 mg/dL (ref 0.0–1.2)
BUN/Creatinine Ratio: 8 — ABNORMAL LOW (ref 9–20)
BUN: 7 mg/dL (ref 6–24)
CALCIUM: 9.1 mg/dL (ref 8.7–10.2)
CO2: 27 mmol/L (ref 18–29)
CREATININE: 0.92 mg/dL (ref 0.76–1.27)
Chloride: 100 mmol/L (ref 97–108)
GFR calc non Af Amer: 102 mL/min/{1.73_m2} (ref >59)
GFR, EST AFRICAN AMERICAN: 118 mL/min/{1.73_m2} (ref >59)
GLOBULIN, TOTAL: 2.5 g/dL (ref 1.5–4.5)
GLUCOSE: 93 mg/dL (ref 65–99)
Potassium: 4.4 mmol/L (ref 3.5–5.2)
SODIUM: 141 mmol/L (ref 134–144)
Total Protein: 7.1 g/dL (ref 6.0–8.5)

## 2014-02-06 LAB — PSA, TOTAL AND FREE
PSA FREE: 0.41 ng/mL
PSA, Free Pct: 51.3 %
PSA: 0.8 ng/mL (ref 0.0–4.0)

## 2014-02-06 LAB — NMR, LIPOPROFILE
Cholesterol: 160 mg/dL (ref 100–199)
HDL CHOLESTEROL BY NMR: 42 mg/dL (ref >39)
HDL Particle Number: 33.7 umol/L (ref >=30.5)
LDL PARTICLE NUMBER: 1073 nmol/L — AB (ref <1000)
LDL Size: 20.7 nm (ref >20.5)
LDL-C: 91 mg/dL (ref 0–99)
LP-IR Score: 76 — ABNORMAL HIGH (ref <=45)
SMALL LDL PARTICLE NUMBER: 407 nmol/L (ref <=527)
Triglycerides by NMR: 134 mg/dL (ref 0–149)

## 2016-01-17 ENCOUNTER — Ambulatory Visit (INDEPENDENT_AMBULATORY_CARE_PROVIDER_SITE_OTHER): Payer: PRIVATE HEALTH INSURANCE

## 2016-01-17 DIAGNOSIS — Z23 Encounter for immunization: Secondary | ICD-10-CM

## 2019-09-26 ENCOUNTER — Ambulatory Visit (INDEPENDENT_AMBULATORY_CARE_PROVIDER_SITE_OTHER): Payer: Managed Care, Other (non HMO) | Admitting: Family Medicine

## 2019-09-26 ENCOUNTER — Other Ambulatory Visit: Payer: Self-pay

## 2019-09-26 ENCOUNTER — Encounter: Payer: Self-pay | Admitting: Family Medicine

## 2019-09-26 VITALS — BP 134/85 | HR 89 | Temp 97.7°F | Ht 67.0 in | Wt 211.6 lb

## 2019-09-26 DIAGNOSIS — N3010 Interstitial cystitis (chronic) without hematuria: Secondary | ICD-10-CM | POA: Diagnosis not present

## 2019-09-26 DIAGNOSIS — Z125 Encounter for screening for malignant neoplasm of prostate: Secondary | ICD-10-CM

## 2019-09-26 DIAGNOSIS — Z0000 Encounter for general adult medical examination without abnormal findings: Secondary | ICD-10-CM

## 2019-09-26 DIAGNOSIS — Z0001 Encounter for general adult medical examination with abnormal findings: Secondary | ICD-10-CM

## 2019-09-26 NOTE — Progress Notes (Signed)
New Patient Office Visit  Assessment & Plan:  1. Well adult exam - Preventive health education provided. Patient declined HIV and hepatitis C screening, TDAP, and COVID-19 vaccines.  - CBC with Differential/Platelet - CMP14+EGFR - Lipid panel - PSA, total and free  2. Cystitis, interstitial - Well controlled on current regimen. Managed by urology.   3. Screening for prostate cancer - PSA, total and free   Follow-up: Return in about 1 year (around 09/25/2020).   Hendricks Limes, MSN, APRN, FNP-C [facility]  Subjective:  Patient ID: KAYDE WAREHIME, male    DOB: 01/27/1971  Age: 49 y.o. MRN: [id number]  Patient Care Team: Loman Brooklyn, FNP as PCP - General (Family Medicine) Festus Aloe, MD as Consulting Physician (Urology)  CC:  Chief Complaint  Patient presents with  . New Patient (Initial Visit)    Previous patient of [facility].  Marland Kitchen Establish Care  . Annual Exam    HPI ABDULLAHI VALLONE presents to establish care.   Patient is taking amitriptyline for interstitial cystitis, which is working well. This is prescribed by his urologist, Dr. Patsy Baltimore.   He has no complaints or concerns. He is only here because his wife made him come for a check-up.    Review of Systems  Constitutional: Negative for chills, fever, malaise/fatigue and weight loss.  HENT: Negative for congestion, ear discharge, ear pain, nosebleeds, sinus pain, sore throat and tinnitus.   Eyes: Negative for blurred vision, double vision, pain, discharge and redness.  Respiratory: Negative for cough, shortness of breath and wheezing.   Cardiovascular: Negative for chest pain, palpitations and leg swelling.  Gastrointestinal: Negative for abdominal pain, constipation, diarrhea, heartburn, nausea and vomiting.  Genitourinary: Negative for dysuria, frequency and urgency.  Musculoskeletal: Negative for myalgias.  Skin: Negative for rash.  Neurological: Negative for dizziness, seizures,  weakness and headaches.  Psychiatric/Behavioral: Negative for depression, substance abuse and suicidal ideas. The patient is not nervous/anxious.     Current Outpatient Medications:  .  amitriptyline (ELAVIL) 75 MG tablet, Take 75 mg by mouth at bedtime., Disp: , Rfl:   Allergies  Allergen Reactions  . Bee Venom Swelling  . Pepcid [Famotidine] Itching  . Nexium [Esomeprazole Magnesium] Itching    Past Medical History:  Diagnosis Date  . 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 Elevated LDL cholesterol level 09/27/2019  . GERD (gastroesophageal reflux disease)   . GI bleed   . Migraines     Past Surgical History:  Procedure Laterality Date  . CHOLECYSTECTOMY    . HERNIA REPAIR    . 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 upper GI bleed repair      Family History  Problem Relation Age of Onset  . Breast cancer Mother   . Asthma Son     Social History   Socioeconomic History  . Marital status: Married    Spouse name: Not on file  . Number of children: Not on file  . Years of education: Not on file  . Highest education level: Not on file  Occupational History  . Not on file  Tobacco Use  . Smoking status: Never Smoker  . Smokeless tobacco: Never Used  Vaping Use  . Vaping Use: Never used  Substance and Sexual Activity  . Alcohol use: Never  . Drug use: No  . Sexual activity: Yes    Birth control/protection: None  Other Topics Concern  . Not on file  Social History Narrative  .  Not on file   Social Determinants of Health   Financial Resource Strain:   . Difficulty of Paying Living Expenses:   Food Insecurity:   . Worried About Running Out of Food in the Last Year:   . Ran Out of Food in the Last Year:   Transportation Needs:   . Lack of Transportation (Medical):   . Lack of Transportation (Non-Medical):   Physical Activity:   . Days of Exercise per Week:   . Minutes of Exercise per Session:   Stress:   . Feeling of Stress :     Social Connections:   . Frequency of Communication with Friends and Family:   . Frequency of Social Gatherings with Friends and Family:   . Attends Religious Services:   . Active Member of Clubs or Organizations:   . Attends Club or Organization Meetings:   . Marital Status:   Intimate Partner Violence:   . Fear of Current or Ex-Partner:   . Emotionally Abused:   . Physically Abused:   . Sexually Abused:     Objective:   Today's Vitals: BP 134/85   Pulse 89   Temp 97.7 F (36.5 C) (Temporal)   Ht 5' 7" (1.702 m)   Wt 211 lb 9.6 oz (96 kg)   SpO2 96%   BMI 33.14 kg/m   Physical Exam Vitals reviewed.  Constitutional:      General: He is not in acute distress.    Appearance: Normal appearance. He is not ill-appearing, toxic-appearing or diaphoretic.  HENT:     Head: Normocephalic and atraumatic.     Right Ear: Tympanic membrane, ear canal and external ear normal. There is no impacted cerumen.     Left Ear: Tympanic membrane, ear canal and external ear normal. There is no impacted cerumen.     Nose: Nose normal. No congestion or rhinorrhea.     Mouth/Throat:     Mouth: Mucous membranes are moist.     Pharynx: Oropharynx is clear. No oropharyngeal exudate or posterior oropharyngeal erythema.  Eyes:     General: No scleral icterus.       Right eye: No discharge.        Left eye: No discharge.     Conjunctiva/sclera: Conjunctivae normal.     Pupils: Pupils are equal, round, and reactive to light.  Neck:     Vascular: No carotid bruit.  Cardiovascular:     Rate and Rhythm: Normal rate and regular rhythm.     Heart sounds: Normal heart sounds. No murmur heard.  No friction rub. No gallop.   Pulmonary:     Effort: Pulmonary effort is normal. No respiratory distress.     Breath sounds: Normal breath sounds. No stridor. No wheezing, rhonchi or rales.  Abdominal:     General: Abdomen is flat. Bowel sounds are normal. There is no distension.     Palpations: Abdomen is  soft. There is no hepatomegaly, splenomegaly or mass.     Tenderness: There is no abdominal tenderness. There is no guarding or rebound.     Hernia: No hernia is present.       Comments: Circled area is more firm than the rest of his abdomen but does not feel like a hernia or mass. He has regular bowel movements. Denies any pain.   Musculoskeletal:        General: Normal range of motion.     Cervical back: Normal range of motion and neck supple. No rigidity. No muscular   tenderness.     Right lower leg: No edema.     Left lower leg: No edema.  Lymphadenopathy:     Cervical: No cervical adenopathy.  Skin:    General: Skin is warm and dry.     Capillary Refill: Capillary refill takes less than 2 seconds.  Neurological:     General: No focal deficit present.     Mental Status: He is alert and oriented to person, place, and time. Mental status is at baseline.  Psychiatric:        Mood and Affect: Mood normal.        Behavior: Behavior normal.        Thought Content: Thought content normal.        Judgment: Judgment normal.      

## 2019-09-26 NOTE — Patient Instructions (Signed)
Preventive Care 54-49 Years Old, Male Preventive care refers to lifestyle choices and visits with your health care provider that can promote health and wellness. This includes:  A yearly physical exam. This is also called an annual well check.  Regular dental and eye exams.  Immunizations.  Screening for certain conditions.  Healthy lifestyle choices, such as eating a healthy diet, getting regular exercise, not using drugs or products that contain nicotine and tobacco, and limiting alcohol use. What can I expect for my preventive care visit? Physical exam Your health care provider will check:  Height and weight. These may be used to calculate body mass index (BMI), which is a measurement that tells if you are at a healthy weight.  Heart rate and blood pressure.  Your skin for abnormal spots. Counseling Your health care provider may ask you questions about:  Alcohol, tobacco, and drug use.  Emotional well-being.  Home and relationship well-being.  Sexual activity.  Eating habits.  Work and work Statistician. What immunizations do I need?  Influenza (flu) vaccine  This is recommended every year. Tetanus, diphtheria, and pertussis (Tdap) vaccine  You may need a Td booster every 10 years. Varicella (chickenpox) vaccine  You may need this vaccine if you have not already been vaccinated. Zoster (shingles) vaccine  You may need this after age 49. Measles, mumps, and rubella (MMR) vaccine  You may need at least one dose of MMR if you were born in 1957 or later. You may also need a second dose. Pneumococcal conjugate (PCV13) vaccine  You may need this if you have certain conditions and were not previously vaccinated. Pneumococcal polysaccharide (PPSV23) vaccine  You may need one or two doses if you smoke cigarettes or if you have certain conditions. Meningococcal conjugate (MenACWY) vaccine  You may need this if you have certain conditions. Hepatitis A  vaccine  You may need this if you have certain conditions or if you travel or work in places where you may be exposed to hepatitis A. Hepatitis B vaccine  You may need this if you have certain conditions or if you travel or work in places where you may be exposed to hepatitis B. Haemophilus influenzae type b (Hib) vaccine  You may need this if you have certain risk factors. Human papillomavirus (HPV) vaccine  If recommended by your health care provider, you may need three doses over 6 months. You may receive vaccines as individual doses or as more than one vaccine together in one shot (combination vaccines). Talk with your health care provider about the risks and benefits of combination vaccines. What tests do I need? Blood tests  Lipid and cholesterol levels. These may be checked every 5 years, or more frequently if you are over 25 years old.  Hepatitis C test.  Hepatitis B test. Screening  Lung cancer screening. You may have this screening every year starting at age 49 if you have a 30-pack-year history of smoking and currently smoke or have quit within the past 15 years.  Prostate cancer screening. Recommendations will vary depending on your family history and other risks.  Colorectal cancer screening. All adults should have this screening starting at age 49 and continuing until age 49. Your health care provider may recommend screening at age 49 if you are at increased risk. You will have tests every 1-10 years, depending on your results and the type of screening test.  Diabetes screening. This is done by checking your blood sugar (glucose) after you have not  eaten for a while (fasting). You may have this done every 1-3 years.  Sexually transmitted disease (STD) testing. Follow these instructions at home: Eating and drinking  Eat a diet that includes fresh fruits and vegetables, whole grains, lean protein, and low-fat dairy products.  Take vitamin and mineral supplements as  recommended by your health care provider.  Do not drink alcohol if your health care provider tells you not to drink.  If you drink alcohol: ? Limit how much you have to 0-2 drinks a day. ? Be aware of how much alcohol is in your drink. In the [place] one drink equals one 12 oz bottle of beer (355 mL), one 5 oz glass of wine (148 mL), or one 1 oz glass of hard liquor (44 mL). Lifestyle  Take daily care of your teeth and gums.  Stay active. Exercise for at least 30 minutes on 5 or more days each week.  Do not use any products that contain nicotine or tobacco, such as cigarettes, e-cigarettes, and chewing tobacco. If you need help quitting, ask your health care provider.  If you are sexually active, practice safe sex. Use a condom or other form of protection to prevent STIs (sexually transmitted infections).  Talk with your health care provider about taking a low-dose aspirin every day starting at age 49. What's next?  Go to your health care provider once a year for a well check visit.  Ask your health care provider how often you should have your eyes and teeth checked.  Stay up to date on all vaccines. This information is not intended to replace advice given to you by your health care provider. Make sure you discuss any questions you have with your health care provider. Document Revised: 03/16/2018 Document Reviewed: 03/16/2018 Elsevier Patient Education  2020 [employer]vier Inc.   

## 2019-09-27 ENCOUNTER — Encounter: Payer: Self-pay | Admitting: Family Medicine

## 2019-09-27 DIAGNOSIS — E7800 Pure hypercholesterolemia, unspecified: Secondary | ICD-10-CM | POA: Insufficient documentation

## 2019-09-27 HISTORY — DX: Pure hypercholesterolemia, unspecified: E78.00

## 2019-09-27 LAB — CMP14+EGFR
ALT: 15 IU/L (ref 0–44)
AST: 17 IU/L (ref 0–40)
Albumin/Globulin Ratio: 1.9 (ref 1.2–2.2)
Albumin: 4.5 g/dL (ref 4.0–5.0)
Alkaline Phosphatase: 95 IU/L (ref 48–121)
BUN/Creatinine Ratio: 9 (ref 9–20)
BUN: 8 mg/dL (ref 6–24)
Bilirubin Total: 0.3 mg/dL (ref 0.0–1.2)
CO2: 25 mmol/L (ref 20–29)
Calcium: 8.9 mg/dL (ref 8.7–10.2)
Chloride: 102 mmol/L (ref 96–106)
Creatinine, Ser: 0.91 mg/dL (ref 0.76–1.27)
GFR calc Af Amer: 115 mL/min/{1.73_m2} (ref >59)
GFR calc non Af Amer: 99 mL/min/{1.73_m2} (ref >59)
Globulin, Total: 2.4 g/dL (ref 1.5–4.5)
Glucose: 96 mg/dL (ref 65–99)
Potassium: 4.2 mmol/L (ref 3.5–5.2)
Sodium: 141 mmol/L (ref 134–144)
Total Protein: 6.9 g/dL (ref 6.0–8.5)

## 2019-09-27 LAB — CBC WITH DIFFERENTIAL/PLATELET
Basophils Absolute: 0.1 10*3/uL (ref 0.0–0.2)
Basos: 1 %
EOS (ABSOLUTE): 0.2 10*3/uL (ref 0.0–0.4)
Eos: 3 %
Hematocrit: 45.3 % (ref 37.5–51.0)
Hemoglobin: 15.2 g/dL (ref 13.0–17.7)
Immature Grans (Abs): 0 10*3/uL (ref 0.0–0.1)
Immature Granulocytes: 0 %
Lymphocytes Absolute: 1.6 10*3/uL (ref 0.7–3.1)
Lymphs: 25 %
MCH: 30 pg (ref 26.6–33.0)
MCHC: 33.6 g/dL (ref 31.5–35.7)
MCV: 89 fL (ref 79–97)
Monocytes Absolute: 0.7 10*3/uL (ref 0.1–0.9)
Monocytes: 11 %
Neutrophils Absolute: 3.8 10*3/uL (ref 1.4–7.0)
Neutrophils: 60 %
Platelets: 338 10*3/uL (ref 150–450)
RBC: 5.07 x10E6/uL (ref 4.14–5.80)
RDW: 12.7 % (ref 11.6–15.4)
WBC: 6.4 10*3/uL (ref 3.4–10.8)

## 2019-09-27 LAB — LIPID PANEL
Chol/HDL Ratio: 4.7 ratio (ref 0.0–5.0)
Cholesterol, Total: 180 mg/dL (ref 100–199)
HDL: 38 mg/dL — ABNORMAL LOW (ref >39)
LDL Chol Calc (NIH): 124 mg/dL — ABNORMAL HIGH (ref 0–99)
Triglycerides: 100 mg/dL (ref 0–149)
VLDL Cholesterol Cal: 18 mg/dL (ref 5–40)

## 2019-09-27 LAB — PSA, TOTAL AND FREE
PSA, Free Pct: 31 %
PSA, Free: 0.31 ng/mL
Prostate Specific Ag, Serum: 1 ng/mL (ref 0.0–4.0)

## 2019-09-29 ENCOUNTER — Encounter: Payer: Self-pay | Admitting: Family Medicine

## 2020-01-31 ENCOUNTER — Telehealth: Payer: Self-pay

## 2022-01-21 ENCOUNTER — Ambulatory Visit (INDEPENDENT_AMBULATORY_CARE_PROVIDER_SITE_OTHER): Payer: Managed Care, Other (non HMO) | Admitting: Family Medicine

## 2022-01-21 ENCOUNTER — Encounter: Payer: Self-pay | Admitting: Family Medicine

## 2022-01-21 VITALS — BP 137/90 | HR 72 | Temp 98.1°F | Ht 67.0 in | Wt 203.0 lb

## 2022-01-21 DIAGNOSIS — Z91199 Patient's noncompliance with other medical treatment and regimen due to unspecified reason: Secondary | ICD-10-CM

## 2022-01-22 NOTE — Progress Notes (Signed)
Not seen

## 2022-04-25 DIAGNOSIS — Z8616 Personal history of COVID-19: Secondary | ICD-10-CM

## 2022-04-25 HISTORY — DX: Personal history of COVID-19: Z86.16

## 2022-06-29 ENCOUNTER — Other Ambulatory Visit: Payer: Self-pay | Admitting: Urology

## 2022-07-22 ENCOUNTER — Encounter (HOSPITAL_BASED_OUTPATIENT_CLINIC_OR_DEPARTMENT_OTHER): Payer: Self-pay | Admitting: Urology

## 2022-07-22 ENCOUNTER — Other Ambulatory Visit: Payer: Self-pay

## 2022-07-22 NOTE — Progress Notes (Signed)
Spoke w/ via phone for pre-op interview---pt Lab needs dos----   none            Lab results------none COVID test -----patient states asymptomatic no test needed Arrive at -------715 am 08-06-2022 NPO after MN NO Solid Food.  Clear liquids from MN until---615 am Med rec completed Medications to take morning of surgery -----none Diabetic medication -----n/a Patient instructed no nail polish to be worn day of surgery Patient instructed to bring photo id and insurance card day of surgery Patient aware to have Driver (ride ) / caregiver wife amy   for 24 hours after surgery  Patient Special Instructions -----none Pre-Op special Instructions -----none Patient verbalized understanding of instructions that were given at this phone interview. Patient denies shortness of breath, chest pain, fever, cough at this phone interview.

## 2022-08-06 ENCOUNTER — Encounter (HOSPITAL_BASED_OUTPATIENT_CLINIC_OR_DEPARTMENT_OTHER)
Admission: RE | Disposition: A | Discharge status: Home / Self Care | Payer: Self-pay | Source: Home / Self Care | Attending: Urology

## 2022-08-06 ENCOUNTER — Ambulatory Visit (HOSPITAL_BASED_OUTPATIENT_CLINIC_OR_DEPARTMENT_OTHER): Payer: Managed Care, Other (non HMO) | Admitting: Certified Registered Nurse Anesthetist

## 2022-08-06 ENCOUNTER — Encounter (HOSPITAL_BASED_OUTPATIENT_CLINIC_OR_DEPARTMENT_OTHER): Payer: Self-pay | Admitting: Urology

## 2022-08-06 ENCOUNTER — Ambulatory Visit (HOSPITAL_BASED_OUTPATIENT_CLINIC_OR_DEPARTMENT_OTHER)
Admission: RE | Admit: 2022-08-06 | Discharge: 2022-08-06 | Disposition: A | Discharge status: Home / Self Care | Payer: Managed Care, Other (non HMO) | Attending: Urology | Admitting: Urology

## 2022-08-06 ENCOUNTER — Other Ambulatory Visit: Payer: Self-pay

## 2022-08-06 DIAGNOSIS — N138 Other obstructive and reflux uropathy: Secondary | ICD-10-CM

## 2022-08-06 DIAGNOSIS — N3010 Interstitial cystitis (chronic) without hematuria: Secondary | ICD-10-CM | POA: Diagnosis not present

## 2022-08-06 DIAGNOSIS — E669 Obesity, unspecified: Secondary | ICD-10-CM

## 2022-08-06 DIAGNOSIS — N401 Enlarged prostate with lower urinary tract symptoms: Secondary | ICD-10-CM | POA: Insufficient documentation

## 2022-08-06 DIAGNOSIS — N312 Flaccid neuropathic bladder, not elsewhere classified: Secondary | ICD-10-CM | POA: Insufficient documentation

## 2022-08-06 DIAGNOSIS — R338 Other retention of urine: Secondary | ICD-10-CM

## 2022-08-06 DIAGNOSIS — Z6830 Body mass index (BMI) 30.0-30.9, adult: Secondary | ICD-10-CM

## 2022-08-06 DIAGNOSIS — Z01818 Encounter for other preprocedural examination: Secondary | ICD-10-CM

## 2022-08-06 HISTORY — PX: TRANSURETHRAL INCISION OF BLADDER NECK: SHX6152

## 2022-08-06 HISTORY — DX: Other specified health status: Z78.9

## 2022-08-06 HISTORY — DX: Retention of urine, unspecified: R33.9

## 2022-08-06 SURGERY — INCISION, BLADDER NECK, TRANSURETHRAL
Anesthesia: General | Site: Prostate

## 2022-08-06 MED ORDER — PROMETHAZINE HCL 25 MG/ML IJ SOLN: 6.2500 mg | INTRAMUSCULAR | Status: DC | PRN

## 2022-08-06 MED ORDER — MEPERIDINE HCL 25 MG/ML IJ SOLN: 6.2500 mg | INTRAMUSCULAR | Status: DC | PRN

## 2022-08-06 MED ORDER — DEXAMETHASONE SODIUM PHOSPHATE 10 MG/ML IJ SOLN
INTRAMUSCULAR | Status: AC
  Filled 2022-08-06: qty 3

## 2022-08-06 MED ORDER — PROPOFOL 10 MG/ML IV BOLUS
INTRAVENOUS | Status: DC | PRN
  Administered 2022-08-06: 200 mg via INTRAVENOUS
  Administered 2022-08-06: 20 mg via INTRAVENOUS

## 2022-08-06 MED ORDER — CEFAZOLIN SODIUM-DEXTROSE 2-4 GM/100ML-% IV SOLN
2.0000 g | INTRAVENOUS | Status: AC
  Administered 2022-08-06: 2 g via INTRAVENOUS

## 2022-08-06 MED ORDER — LIDOCAINE HCL (PF) 2 % IJ SOLN
INTRAMUSCULAR | Status: AC
  Filled 2022-08-06: qty 20

## 2022-08-06 MED ORDER — ROCURONIUM BROMIDE 10 MG/ML (PF) SYRINGE
PREFILLED_SYRINGE | INTRAVENOUS | Status: AC
  Filled 2022-08-06: qty 10

## 2022-08-06 MED ORDER — FENTANYL CITRATE (PF) 250 MCG/5ML IJ SOLN
INTRAMUSCULAR | Status: DC | PRN
  Administered 2022-08-06: 25 ug via INTRAVENOUS
  Administered 2022-08-06: 50 ug via INTRAVENOUS

## 2022-08-06 MED ORDER — MIDAZOLAM HCL 2 MG/2ML IJ SOLN
INTRAMUSCULAR | Status: DC | PRN
  Administered 2022-08-06: 2 mg via INTRAVENOUS

## 2022-08-06 MED ORDER — PROPOFOL 10 MG/ML IV BOLUS
INTRAVENOUS | Status: AC
  Filled 2022-08-06: qty 20

## 2022-08-06 MED ORDER — AMISULPRIDE (ANTIEMETIC) 5 MG/2ML IV SOLN: 10.0000 mg | Freq: Once | INTRAVENOUS | Status: DC | PRN

## 2022-08-06 MED ORDER — SODIUM CHLORIDE 0.9 % IR SOLN
Status: DC | PRN
  Administered 2022-08-06: 6000 mL

## 2022-08-06 MED ORDER — DEXAMETHASONE SODIUM PHOSPHATE 10 MG/ML IJ SOLN
INTRAMUSCULAR | Status: DC | PRN
  Administered 2022-08-06: 10 mg via INTRAVENOUS

## 2022-08-06 MED ORDER — MIDAZOLAM HCL 2 MG/2ML IJ SOLN
INTRAMUSCULAR | Status: AC
  Filled 2022-08-06: qty 2

## 2022-08-06 MED ORDER — HYDROMORPHONE HCL 1 MG/ML IJ SOLN: 0.2500 mg | INTRAMUSCULAR | Status: DC | PRN

## 2022-08-06 MED ORDER — NITROFURANTOIN MACROCRYSTAL 100 MG PO CAPS: 100.0000 mg | ORAL_CAPSULE | Freq: Every day | ORAL | 0 refills | Status: AC

## 2022-08-06 MED ORDER — PHENYLEPHRINE 80 MCG/ML (10ML) SYRINGE FOR IV PUSH (FOR BLOOD PRESSURE SUPPORT)
PREFILLED_SYRINGE | INTRAVENOUS | Status: AC
  Filled 2022-08-06: qty 20

## 2022-08-06 MED ORDER — PHENYLEPHRINE 80 MCG/ML (10ML) SYRINGE FOR IV PUSH (FOR BLOOD PRESSURE SUPPORT)
PREFILLED_SYRINGE | INTRAVENOUS | Status: DC | PRN
  Administered 2022-08-06: 80 ug via INTRAVENOUS
  Administered 2022-08-06: 160 ug via INTRAVENOUS
  Administered 2022-08-06 (×3): 80 ug via INTRAVENOUS

## 2022-08-06 MED ORDER — EPHEDRINE SULFATE-NACL 50-0.9 MG/10ML-% IV SOSY
PREFILLED_SYRINGE | INTRAVENOUS | Status: DC | PRN
  Administered 2022-08-06: 10 mg via INTRAVENOUS

## 2022-08-06 MED ORDER — 0.9 % SODIUM CHLORIDE (POUR BTL) OPTIME
TOPICAL | Status: DC | PRN
  Administered 2022-08-06: 500 mL

## 2022-08-06 MED ORDER — ONDANSETRON HCL 4 MG/2ML IJ SOLN
INTRAMUSCULAR | Status: AC
  Filled 2022-08-06: qty 6

## 2022-08-06 MED ORDER — LACTATED RINGERS IV SOLN: INTRAVENOUS | Status: DC

## 2022-08-06 MED ORDER — ONDANSETRON HCL 4 MG/2ML IJ SOLN
INTRAMUSCULAR | Status: DC | PRN
  Administered 2022-08-06: 4 mg via INTRAVENOUS

## 2022-08-06 MED ORDER — EPHEDRINE 5 MG/ML INJ
INTRAVENOUS | Status: AC
  Filled 2022-08-06: qty 5

## 2022-08-06 MED ORDER — FENTANYL CITRATE (PF) 100 MCG/2ML IJ SOLN
INTRAMUSCULAR | Status: AC
  Filled 2022-08-06: qty 2

## 2022-08-06 MED ORDER — LIDOCAINE 2% (20 MG/ML) 5 ML SYRINGE
INTRAMUSCULAR | Status: DC | PRN
  Administered 2022-08-06: 80 mg via INTRAVENOUS

## 2022-08-06 MED ORDER — OXYCODONE HCL 5 MG PO TABS: 5.0000 mg | ORAL_TABLET | Freq: Once | ORAL | Status: DC | PRN

## 2022-08-06 MED ORDER — CEFAZOLIN SODIUM-DEXTROSE 2-4 GM/100ML-% IV SOLN
INTRAVENOUS | Status: AC
  Filled 2022-08-06: qty 100

## 2022-08-06 MED ORDER — OXYCODONE HCL 5 MG/5ML PO SOLN: 5.0000 mg | Freq: Once | ORAL | Status: DC | PRN

## 2022-08-06 SURGICAL SUPPLY — 37 items
BAG DRAIN URO-CYSTO SKYTR STRL (DRAIN) ×1 IMPLANT
BAG DRN RND TRDRP ANRFLXCHMBR (UROLOGICAL SUPPLIES) ×1
BAG DRN UROCATH (DRAIN) ×1
BAG URINE DRAIN 2000ML AR STRL (UROLOGICAL SUPPLIES) ×1 IMPLANT
CATH COUDE FOLEY 2W 5CC 18FR (CATHETERS) IMPLANT
CATH FOLEY 2WAY SLVR  5CC 18FR (CATHETERS)
CATH FOLEY 2WAY SLVR  5CC 20FR (CATHETERS) ×1
CATH FOLEY 2WAY SLVR 5CC 18FR (CATHETERS) IMPLANT
CATH FOLEY 2WAY SLVR 5CC 20FR (CATHETERS) IMPLANT
CATH FOLEY 3WAY 30CC 22FR (CATHETERS) IMPLANT
CLOTH BEACON ORANGE TIMEOUT ST (SAFETY) ×1 IMPLANT
ELECT BIVAP BIPO 22/24 DONUT (ELECTROSURGICAL)
ELECTRD BIVAP BIPO 22/24 DONUT (ELECTROSURGICAL) IMPLANT
FEE LASER CYBER TM PROCEDURE (MISCELLANEOUS) IMPLANT
GLOVE BIO SURGEON STRL SZ7.5 (GLOVE) ×1 IMPLANT
GLOVE BIO SURGEON STRL SZ8 (GLOVE) IMPLANT
GOWN STRL REUS W/TWL LRG LVL3 (GOWN DISPOSABLE) ×1 IMPLANT
HOLDER FOLEY CATH W/STRAP (MISCELLANEOUS) IMPLANT
IV NS 1000ML (IV SOLUTION)
IV NS 1000ML BAXH (IV SOLUTION) ×1 IMPLANT
IV NS IRRIG 3000ML ARTHROMATIC (IV SOLUTION) ×1 IMPLANT
IV SET EXTENSION GRAVITY 40 LF (IV SETS) ×1 IMPLANT
KIT TURNOVER CYSTO (KITS) ×1 IMPLANT
LASER CYBER FIBER (MISCELLANEOUS) IMPLANT
LASER CYBER PROCEDURE (MISCELLANEOUS) ×1
LASER CYBER TM PROCEDURE (MISCELLANEOUS) ×1 IMPLANT
LASER REVOLIX HI ENERGY 1000 (Laser) ×1 IMPLANT
LASER REVOLIX PROCEDURE (MISCELLANEOUS) ×1 IMPLANT
LOOP CUT BIPOLAR 24F LRG (ELECTROSURGICAL) IMPLANT
MANIFOLD NEPTUNE II (INSTRUMENTS) IMPLANT
PACK CYSTO (CUSTOM PROCEDURE TRAY) ×1 IMPLANT
SLEEVE SCD COMPRESS KNEE MED (STOCKING) ×1 IMPLANT
SYR 30ML LL (SYRINGE) IMPLANT
SYR TOOMEY IRRIG 70ML (MISCELLANEOUS) ×1
SYRINGE TOOMEY IRRIG 70ML (MISCELLANEOUS) IMPLANT
TUBE CONNECTING 12X1/4 (SUCTIONS) IMPLANT
TUBING UROLOGY SET (TUBING) IMPLANT

## 2022-08-06 NOTE — Anesthesia Postprocedure Evaluation (Signed)
Anesthesia Post Note  Patient: GRANT WOODRUM  Procedure(s) Performed: TRANSURETHRAL LASER INCISION OF BLADDER NECK AND PROSTATE ABLATION WITH THULIUM LASER (Prostate)     Patient location during evaluation: PACU Anesthesia Type: General Level of consciousness: awake and alert Pain management: pain level controlled Vital Signs Assessment: post-procedure vital signs reviewed and stable Respiratory status: spontaneous breathing, nonlabored ventilation and respiratory function stable Cardiovascular status: blood pressure returned to baseline and stable Postop Assessment: no apparent nausea or vomiting Anesthetic complications: no   No notable events documented.  Last Vitals:  Vitals:   08/06/22 1041 08/06/22 1045  BP: (!) 129/91 126/82  Pulse: 81 80  Resp: 17 15  Temp: 36.6 C   SpO2: 98% 97%    Last Pain:  Vitals:   08/06/22 1041  TempSrc:   PainSc: 0-No pain                 Lowella Curb

## 2022-08-06 NOTE — Anesthesia Procedure Notes (Signed)
Procedure Name: LMA Insertion Date/Time: 08/06/2022 9:22 AM  Performed by: Dairl Ponder, CRNAPre-anesthesia Checklist: Patient identified, Emergency Drugs available, Suction available and Patient being monitored Patient Re-evaluated:Patient Re-evaluated prior to induction Oxygen Delivery Method: Circle System Utilized Preoxygenation: Pre-oxygenation with 100% oxygen Induction Type: IV induction Ventilation: Mask ventilation without difficulty LMA: LMA inserted LMA Size: 4.0 Number of attempts: 1 Airway Equipment and Method: Bite block Placement Confirmation: positive ETCO2 Tube secured with: Tape Dental Injury: Teeth and Oropharynx as per pre-operative assessment

## 2022-08-06 NOTE — Transfer of Care (Signed)
Immediate Anesthesia Transfer of Care Note  Patient: Jared Henry  Procedure(s) Performed: TRANSURETHRAL LASER INCISION OF BLADDER NECK AND PROSTATE ABLATION WITH THULIUM LASER (Prostate)  Patient Location: PACU  Anesthesia Type:General  Level of Consciousness: drowsy and patient cooperative  Airway & Oxygen Therapy: Patient Spontanous Breathing and Patient connected to nasal cannula oxygen  Post-op Assessment: Report given to RN and Post -op Vital signs reviewed and stable  Post vital signs: Reviewed and stable  Last Vitals:  Vitals Value Taken Time  BP 131/93 08/06/22 1019  Temp 36.6 C 08/06/22 1018  Pulse 92 08/06/22 1026  Resp 14 08/06/22 1026  SpO2 96 % 08/06/22 1026  Vitals shown include unvalidated device data.  Last Pain:  Vitals:   08/06/22 1018  TempSrc:   PainSc: 0-No pain      Patients Stated Pain Goal: 5 (08/06/22 0800)  Complications: No notable events documented.

## 2022-08-06 NOTE — Anesthesia Preprocedure Evaluation (Signed)
Anesthesia Evaluation  Patient identified by MRN, date of birth, ID band Patient awake    Reviewed: Allergy & Precautions, H&P , NPO status , Patient's Chart, lab work & pertinent test results  Airway Mallampati: II  TM Distance: >3 FB Neck ROM: Full    Dental no notable dental hx.    Pulmonary neg pulmonary ROS   Pulmonary exam normal breath sounds clear to auscultation       Cardiovascular negative cardio ROS Normal cardiovascular exam Rhythm:Regular Rate:Normal     Neuro/Psych  Headaches  negative psych ROS   GI/Hepatic negative GI ROS, Neg liver ROS,,,  Endo/Other  negative endocrine ROS    Renal/GU negative Renal ROS  negative genitourinary   Musculoskeletal negative musculoskeletal ROS (+)    Abdominal  (+) + obese  Peds negative pediatric ROS (+)  Hematology negative hematology ROS (+)   Anesthesia Other Findings   Reproductive/Obstetrics negative OB ROS                             Anesthesia Physical Anesthesia Plan  ASA: 2  Anesthesia Plan: General   Post-op Pain Management:    Induction: Intravenous  PONV Risk Score and Plan: 2 and Ondansetron, Midazolam and Treatment may vary due to age or medical condition  Airway Management Planned: LMA  Additional Equipment:   Intra-op Plan:   Post-operative Plan: Extubation in OR  Informed Consent: I have reviewed the patients History and Physical, chart, labs and discussed the procedure including the risks, benefits and alternatives for the proposed anesthesia with the patient or authorized representative who has indicated his/her understanding and acceptance.     Dental advisory given  Plan Discussed with: CRNA  Anesthesia Plan Comments:        Anesthesia Quick Evaluation

## 2022-08-06 NOTE — H&P (Signed)
H&P  Chief Complaint: Hypotonic bladder, urinary retention  History of Present Illness: Jared Henry is a 52 year old male with a history of frequency and urgency and IC.  On more recent follow-up he developed increasing postvoid residuals that finally went up to 1000 cc in October 2023.  Urodynamics revealed a large capacity bladder and no voiding.  He had a normal size prostate on ultrasound at 25 g and cystoscopy revealed a very high and tight bladder neck consistent with possible bladder neck obstruction.  He is now doing CIC and not voiding in between and we discussed an outlet procedure to see if we could tip the favor back in the balance where he might be able to void.  He presents today for thulium laser incision and ablation of the prostate.  He has been well without fever, gross hematuria or dysuria.  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   Past Week   amitriptyline (ELAVIL) 75 MG tablet Take 75 mg by mouth at bedtime.   08/05/2022   ibuprofen (ADVIL) 600 MG tablet Take 600 mg by mouth every 6 (six) hours as needed.   Past Week   oxybutynin (DITROPAN) 5 MG tablet Take 5 mg by mouth daily at 6 (six) AM.   More than a month   silodosin (RAPAFLO) 8 MG CAPS capsule Take 8 mg by mouth daily with breakfast.   More than a month   Allergies:  Allergies  Allergen Reactions   Bee Venom Swelling   Esomeprazole Itching and Rash   Pepcid  [Famotidine] Itching   Nexium [Esomeprazole Magnesium] Itching    Family History  Problem Relation Age of Onset   Breast cancer Mother    Asthma Son    Social History:  reports that he has never smoked. He has never used smokeless tobacco. He reports that he does not drink alcohol and does not use drugs.  ROS: A complete review of systems was performed.  All systems are negative except for pertinent findings as noted. Review of Systems  All other systems reviewed and are negative.    Physical Exam:  Vital signs in last 24 hours: Temp:  [97.8 F (36.6 C)] 97.8 F (36.6 C) (05/03 0800) Pulse Rate:  [78] 78 (05/03 0800) Resp:  [16] 16 (05/03 0800) BP: (132)/(91) 132/91 (05/03 0800) SpO2:  [99 %] 99 % (05/03 0800) Weight:  [88.3 kg] 88.3 kg (05/03 0800) General:  Alert and oriented, No acute distress HEENT: Normocephalic, atraumatic Cardiovascular: Regular rate and rhythm Lungs: Regular rate and effort Abdomen: Soft, nontender, nondistended, no abdominal masses Back: No CVA tenderness Extremities: No edema Neurologic: Grossly intact  Laboratory Data:  No results found for this or any previous visit (from the past 24 hour(s)). No results found for this or any previous visit (from the past 240 hour(s)). Creatinine: No results for input(s): "CREATININE"  in the last 168 hours.  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  We discussed risk of incontinence, retrograde ejaculation and stricture among others.  We also discussed he may still need to continue  to do CIC due to bladder failure.  All questions answered. Patient elects to proceed.   Jerilee Field 08/06/2022, 9:11 AM

## 2022-08-06 NOTE — Op Note (Signed)
Preoperative diagnosis: BPH, urinary retention, hypotonic bladder  Postoperative diagnosis: Same  Procedure: Thulium laser vaporization of the prostate  Surgeon: Mena Goes  Anesthesia: General  Indication for procedure: Jared Henry is a 52 year old male with a history of interstitial cystitis but he developed more increasing residuals over the past year.  In October his post void went up over thousand and he learn CIC.  Urodynamics revealed a large capacity bladder with a hypotonic bladder and he could not void.  He is brought today for outlet procedure in hopes of improving his chances for voiding on his own.  Findings: On cystoscopy the prostate was obstructed by high bladder neck and some median lobe tissue.  Otherwise the bladder appeared normal with no mucosal lesion.  No stone or foreign body in the bladder.  Ureteral orifice ease were identified pre and post vaporization.  Description of procedure: After consent was obtained patient brought to the operating room.  After adequate anesthesia he is placed in lithotomy position and prepped and draped in the usual sterile fashion.  Timeout was performed to confirm the patient and procedure.  Cystoscope was passed per urethra and the bladder carefully inspected.  I then swapped that out for the laser continuous-flow sheath.  I 1000 m thulium laser fiber was passed the ureteral orifice ease were identified and marked.  I then started at the 5 o'clock position on the patient's left and incised through the prostate and bladder neck down to the prostatic capsule brought that down to the verumontanum.  Similar incision at 7:00 on the right.  There was more median lobe tissue that I appreciated.  This was vaporized.  Some of the lateral lobe tissue was vaporized mainly going over at 3:00 and 9:00 to the bladder neck and then vaporizing some lateral lobe tissue.  This created an excellent channel.  No vaporization was carried out distal to the verumontanum.   Hemostasis was excellent at low pressure.  The scope was backed out and a 20 [place] two-way catheter was [place] without difficulty.  Balloon was inflated and seated at the prostate.  Irrigation was clear.  He was awakened taken recovery room in stable condition.  Complications: None  Blood loss: Minimal  Specimens: None  Drains: 20 French Foley catheter  Disposition: Patient stable to PACU-I discussed with Amy the procedure, postop care and follow-up.

## 2022-08-12 ENCOUNTER — Encounter (HOSPITAL_BASED_OUTPATIENT_CLINIC_OR_DEPARTMENT_OTHER): Payer: Self-pay | Admitting: Urology

## 2023-07-07 NOTE — Progress Notes (Deleted)
   New Patient Office Visit  Subjective   Patient ID: Jared Henry, male    DOB: 21-Dec-1970  Age: 53 y.o. MRN: [id number]  CC: No chief complaint on file.   HPI Jared Henry presents to establish care ***  Outpatient Encounter Medications as of 07/13/2023  Medication Sig  . acetaminophen (TYLENOL) 650 MG CR tablet Take 1,300 mg by mouth every 8 (eight) hours as needed for pain.  Marland Kitchen amitriptyline (ELAVIL) 75 MG tablet Take 75 mg by mouth at bedtime.  Marland Kitchen ibuprofen (ADVIL) 600 MG tablet Take 600 mg by mouth every 6 (six) hours as needed.  . silodosin (RAPAFLO) 8 MG CAPS capsule Take 8 mg by mouth daily with breakfast.   No facility-administered encounter medications on file as of 07/13/2023.    Past Medical History:  Diagnosis Date  . 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 GI bleed 2014  . History of COVID-19 04/25/2022  . Migraines   . Self-catheterizes urinary bladder    3 to 4 x per day  . Urinary retention     Past Surgical History:  Procedure Laterality Date  . CHOLECYSTECTOMY  2010  . NISSEN FUNDOPLICATION  2008  . 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 TRANSURETHRAL INCISION OF BLADDER NECK N/A 08/06/2022   Procedure: TRANSURETHRAL LASER INCISION OF BLADDER NECK AND PROSTATE ABLATION WITH THULIUM LASER;  Surgeon: Jerilee Field, MD;  Location: [facility] [place];  Service: Urology;  Laterality: N/A;    Family History  Problem Relation Age of Onset  . Breast cancer Mother   . Asthma Son     Social History   Socioeconomic History  . Marital status: Married    Spouse name: Not on file  . Number of children: Not on file  . Years of education: Not on file  . Highest education level: Not on file  Occupational History  . Not on file  Tobacco Use  . Smoking status: Never  . Smokeless tobacco: Never  Vaping Use  . Vaping status: Never Used  Substance and Sexual Activity  . Alcohol use: Never  . Drug use: No  . Sexual  activity: Yes    Birth control/protection: None  Other Topics Concern  . Not on file  Social History Narrative  . Not on file   Social Drivers of Health   Financial Resource Strain: Not on file  Food Insecurity: Not on file  Transportation Needs: Not on file  Physical Activity: Not on file  Stress: Not on file  Social Connections: Unknown (08/16/2021)   Received from [facility]   Social Network   . Social Network: Not on file  Intimate Partner Violence: Unknown (07/08/2021)   [facility] from [facility]   HITS   . Physically Hurt: Not on file   . Insult or Talk Down To: Not on file   . Threaten Physical Harm: Not on file   . Scream or Curse: Not on file    ROS Negative unless indicated in HPI    Objective   There were no vitals taken for this visit.  Physical Exam  {Labs (Optional):23779}    Assessment & Plan:  There are no diagnoses linked to this encounter.  No follow-ups on file.   @[name]  Janee Morn WUJ@  Note: This document was prepared by [employer] voice dictation technology and any errors that results from this process are unintentional.

## 2023-07-13 ENCOUNTER — Ambulatory Visit: Payer: Self-pay | Admitting: Nurse Practitioner
